# Patient Record
Sex: Male | Born: 1975 | Race: Black or African American | Hispanic: No | Marital: Single | Smoking: Former smoker
Health system: Southern US, Community
[De-identification: ages and names within clinical notes are randomized; demographics above are authoritative.]

## PROBLEM LIST (undated history)

## (undated) DIAGNOSIS — I10 Essential (primary) hypertension: Secondary | ICD-10-CM

## (undated) DIAGNOSIS — R946 Abnormal results of thyroid function studies: Secondary | ICD-10-CM

## (undated) DIAGNOSIS — K859 Acute pancreatitis without necrosis or infection, unspecified: Secondary | ICD-10-CM

## (undated) HISTORY — PX: NO PAST SURGERIES: SHX2092

---

## 2003-08-02 ENCOUNTER — Encounter: Payer: Self-pay | Admitting: Emergency Medicine

## 2003-08-02 ENCOUNTER — Emergency Department (HOSPITAL_COMMUNITY): Admission: EM | Admit: 2003-08-02 | Discharge: 2003-08-02 | Payer: Self-pay | Admitting: Emergency Medicine

## 2007-03-31 ENCOUNTER — Emergency Department (HOSPITAL_COMMUNITY): Admission: EM | Admit: 2007-03-31 | Discharge: 2007-03-31 | Payer: Self-pay | Admitting: Emergency Medicine

## 2010-12-31 ENCOUNTER — Ambulatory Visit: Payer: Self-pay | Admitting: Internal Medicine

## 2010-12-31 DIAGNOSIS — I10 Essential (primary) hypertension: Secondary | ICD-10-CM

## 2011-07-25 DIAGNOSIS — K859 Acute pancreatitis without necrosis or infection, unspecified: Secondary | ICD-10-CM

## 2011-07-25 HISTORY — DX: Acute pancreatitis without necrosis or infection, unspecified: K85.90

## 2011-08-02 ENCOUNTER — Encounter: Payer: Self-pay | Admitting: Emergency Medicine

## 2011-08-02 ENCOUNTER — Other Ambulatory Visit: Payer: Self-pay

## 2011-08-02 ENCOUNTER — Ambulatory Visit: Payer: Self-pay | Admitting: Internal Medicine

## 2011-08-02 ENCOUNTER — Emergency Department (HOSPITAL_COMMUNITY): Payer: 59

## 2011-08-02 ENCOUNTER — Inpatient Hospital Stay (HOSPITAL_COMMUNITY)
Admission: EM | Admit: 2011-08-02 | Discharge: 2011-08-11 | DRG: 439 | Disposition: A | Discharge status: Home / Self Care | Payer: 59 | Attending: Internal Medicine | Admitting: Internal Medicine

## 2011-08-02 DIAGNOSIS — E876 Hypokalemia: Secondary | ICD-10-CM | POA: Diagnosis present

## 2011-08-02 DIAGNOSIS — K859 Acute pancreatitis without necrosis or infection, unspecified: Principal | ICD-10-CM

## 2011-08-02 DIAGNOSIS — M109 Gout, unspecified: Secondary | ICD-10-CM | POA: Diagnosis not present

## 2011-08-02 DIAGNOSIS — R7309 Other abnormal glucose: Secondary | ICD-10-CM | POA: Diagnosis present

## 2011-08-02 DIAGNOSIS — K852 Alcohol induced acute pancreatitis without necrosis or infection: Secondary | ICD-10-CM | POA: Diagnosis present

## 2011-08-02 DIAGNOSIS — I10 Essential (primary) hypertension: Secondary | ICD-10-CM

## 2011-08-02 DIAGNOSIS — E872 Acidosis, unspecified: Secondary | ICD-10-CM | POA: Diagnosis not present

## 2011-08-02 DIAGNOSIS — R739 Hyperglycemia, unspecified: Secondary | ICD-10-CM | POA: Diagnosis present

## 2011-08-02 DIAGNOSIS — F1010 Alcohol abuse, uncomplicated: Secondary | ICD-10-CM | POA: Diagnosis present

## 2011-08-02 HISTORY — DX: Essential (primary) hypertension: I10

## 2011-08-02 LAB — COMPREHENSIVE METABOLIC PANEL
ALT: 25 U/L (ref 0–53)
AST: 25 U/L (ref 0–37)
Alkaline Phosphatase: 86 U/L (ref 39–117)
CO2: 23 mEq/L (ref 19–32)
Calcium: 9.9 mg/dL (ref 8.4–10.5)
Chloride: 94 mEq/L — ABNORMAL LOW (ref 96–112)
GFR calc Af Amer: >90 mL/min (ref >90)
Total Protein: 8.2 g/dL (ref 6.0–8.3)

## 2011-08-02 LAB — CBC
HCT: 40.6 % (ref 39.0–52.0)
MCH: 32 pg (ref 26.0–34.0)
MCV: 94.2 fL (ref 78.0–100.0)
RDW: 13.3 % (ref 11.5–15.5)
WBC: 17.5 10*3/uL — ABNORMAL HIGH (ref 4.0–10.5)

## 2011-08-02 LAB — LIPID PANEL
Cholesterol: 173 mg/dL (ref 0–200)
HDL: 93 mg/dL (ref >39)
LDL Cholesterol: 70 mg/dL (ref 0–99)
Total CHOL/HDL Ratio: 1.9 RATIO
Triglycerides: 51 mg/dL (ref <150)
VLDL: 10 mg/dL (ref 0–40)

## 2011-08-02 LAB — POCT I-STAT, CHEM 8
BUN: 12 mg/dL (ref 6–23)
Calcium, Ion: 1.17 mmol/L (ref 1.12–1.32)
Chloride: 99 mEq/L (ref 96–112)
Creatinine, Ser: 1.2 mg/dL (ref 0.50–1.35)
Glucose, Bld: 177 mg/dL — ABNORMAL HIGH (ref 70–99)
HCT: 45 % (ref 39.0–52.0)
Potassium: 3.2 mEq/L — ABNORMAL LOW (ref 3.5–5.1)

## 2011-08-02 MED ORDER — THIAMINE HCL 100 MG/ML IJ SOLN: Freq: Once | INTRAVENOUS | Status: DC

## 2011-08-02 MED ORDER — POTASSIUM CHLORIDE 10 MEQ/100ML IV SOLN
10.0000 meq | INTRAVENOUS | Status: AC
  Administered 2011-08-02 – 2011-08-03 (×4): 10 meq via INTRAVENOUS
  Filled 2011-08-02 (×4): qty 100

## 2011-08-02 MED ORDER — ONDANSETRON HCL 4 MG/2ML IJ SOLN
4.0000 mg | Freq: Once | INTRAMUSCULAR | Status: AC
  Administered 2011-08-02: 4 mg via INTRAVENOUS
  Filled 2011-08-02: qty 2

## 2011-08-02 MED ORDER — FAMOTIDINE IN NACL 20-0.9 MG/50ML-% IV SOLN
20.0000 mg | Freq: Once | INTRAVENOUS | Status: AC
  Administered 2011-08-02: 20 mg via INTRAVENOUS
  Filled 2011-08-02: qty 50

## 2011-08-02 MED ORDER — SODIUM CHLORIDE 0.9 % IV SOLN
INTRAVENOUS | Status: AC
  Administered 2011-08-02: 23:00:00 via INTRAVENOUS

## 2011-08-02 MED ORDER — HYDROMORPHONE HCL 1 MG/ML IJ SOLN
1.0000 mg | Freq: Once | INTRAMUSCULAR | Status: AC
  Administered 2011-08-02: 1 mg via INTRAVENOUS
  Filled 2011-08-02: qty 1

## 2011-08-02 MED ORDER — THIAMINE HCL 100 MG/ML IJ SOLN
Freq: Once | INTRAVENOUS | Status: AC
  Administered 2011-08-03: 01:00:00 via INTRAVENOUS
  Filled 2011-08-02: qty 1000

## 2011-08-02 MED ORDER — GI COCKTAIL ~~LOC~~
30.0000 mL | Freq: Once | ORAL | Status: AC
  Administered 2011-08-02: 30 mL via ORAL
  Filled 2011-08-02: qty 30

## 2011-08-02 MED ORDER — LABETALOL HCL 5 MG/ML IV SOLN
10.0000 mg | Freq: Once | INTRAVENOUS | Status: AC
Start: 2011-08-02 — End: 2011-08-02
  Administered 2011-08-02: 10 mg via INTRAVENOUS
  Filled 2011-08-02: qty 4

## 2011-08-02 MED ORDER — HYDROMORPHONE HCL 1 MG/ML IJ SOLN: 1.0000 mg | INTRAMUSCULAR | Status: DC | PRN

## 2011-08-02 MED ORDER — ONDANSETRON HCL 4 MG/2ML IJ SOLN: 4.0000 mg | Freq: Three times a day (TID) | INTRAMUSCULAR | Status: DC | PRN

## 2011-08-02 MED ORDER — SODIUM CHLORIDE 0.9 % IV SOLN
999.0000 mL | Freq: Once | INTRAVENOUS | Status: AC
  Administered 2011-08-02: 999 mL via INTRAVENOUS

## 2011-08-02 NOTE — ED Notes (Signed)
Patient states he is starting to hurt bad again. Pain 8\10. Would like something for pain. Denies nausea. Denies needs. Call bell and visitor at bedside. md aware.

## 2011-08-02 NOTE — H&P (Signed)
PCP:   No primary provider on file.   Chief Complaint:  Epigastric pain x 2 days  HPI: Geoffrey Conner is an 35 y.o. male, African American with a history of hypertension and a strong history of alcohol abuse, otherwise in good health until Sunday he developed severe epigastric pain, no radiation. No nausea or vomiting or diarrhea. No fever.  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 He is tried no home remedies for the pain, was unable to go to work today.  Has no past history of abdominal  pain or liver disease.  The emergency room patient's lipase is markedly elevated and the hospitalist service was called to assist with management.  Past Medical History  Diagnosis Date  . Hypertension     History reviewed. No pertinent past surgical history.  Medications:  HOME MEDS: Prior to Admission medications   Medication Sig Start Date End Date Taking? Authorizing Provider  amLODipine (NORVASC) 10 MG tablet Take 10 mg by mouth daily.     Yes Historical Provider, MD  atenolol (TENORMIN) 50 MG tablet Take 50 mg by mouth every morning.     Yes Historical Provider, MD    PRIOR TO [facility] MED[facility] Medications Prior to Admission  Medication Dose Route Frequency Provider Last Rate Last Dose  . 0.9 %  sodium chloride infusion  999 mL Intravenous Once Gerhard Munch, MD   999 mL at 08/02/11 2103  . 0.9 %  sodium chloride infusion   Intravenous STAT Gerhard Munch, MD 150 mL/hr at 08/02/11 2245    . famotidine (PEPCID) IVPB 20 mg  20 mg Intravenous Once Gerhard Munch, MD   20 mg at 08/02/11 2104  . gi cocktail  30 mL Oral Once Gerhard Munch, MD   30 mL at 08/02/11 2105  . HYDROmorphone (DILAUDID) injection 1 mg  1 mg Intravenous Once Gerhard Munch, MD   1 mg at 08/02/11 2246  . HYDROmorphone (DILAUDID) injection 1 mg  1 mg Intravenous Q4H PRN Gerhard Munch, MD      . labetalol (NORMODYNE,TRANDATE) 5 MG/ML injection 10 mg   10 mg Intravenous Once Gerhard Munch, MD   10 mg at 08/02/11 2150  . ondansetron (ZOFRAN) injection 4 mg  4 mg Intravenous Once Gerhard Munch, MD   4 mg at 08/02/11 2242  . ondansetron (ZOFRAN) injection 4 mg  4 mg Intravenous Q8H PRN Gerhard Munch, MD      . potassium chloride 10 mEq in 100 mL IVPB  10 mEq Intravenous Q1 Hr x 4 Vania Rea      . sodium chloride 0.9 % 1,000 mL with thiamine 100 mg, folic acid 1 mg, multivitamins adult 10 mL infusion   Intravenous Once Vania Rea      . DISCONTD: dextrose 5 % and 0.9% NaCl 1,000 mL with potassium chloride 40 mEq, thiamine (B-1) 100 mg, folic acid 1 mg, multivitamins adult (MVI -12) 10 mL infusion   Intravenous Once Vania Rea       No current outpatient prescriptions on file as of 08/02/2011.    Allergies:  Allergies  Allergen Reactions  . Peanut-Containing Drug Products Shortness Of Breath and Swelling    Social History:   reports that he has quit smoking. He does not have any smokeless tobacco history on file. He reports that he drinks alcohol. He reports that he does not use illicit drugs.  Drinks 4-5 fifths of vodka per  week with a friends, drinks 2 beers per day  Family History: Family History  Problem Relation Age of Onset  . Hypertension Mother   . Hypertension Father   . Cancer Mother     breast 9 yrs ago    Rewiew of Systems:  The patient denies anorexia, fever, weight loss,, vision loss, decreased hearing, hoarseness,  syncope, dyspnea on exertion, peripheral edema, balance deficits, hemoptysis, abdominal pain, melena, hematochezia, severe indigestion/heartburn, hematuria, incontinence, genital sores, muscle weakness, suspicious skin lesions, transient blindness, difficulty walking, depression, unusual weight change, abnormal bleeding, enlarged lymph nodes, angioedema, and breast masses.  Physical Exam: Filed Vitals:   08/02/11 2116 08/02/11 2200 08/02/11 2230 08/02/11 2239  BP: 155/107 166/108  159/101 160/99  Pulse: 75 118 120 129  Temp:   99.5 F (37.5 C)   TempSrc: Oral  Oral   Resp: 20   24  Height:      Weight:      SpO2: 95% 100% 100% 100%   Blood pressure 160/99, pulse 129, temperature 99.5 F (37.5 C), temperature source Oral, resp. rate 24, height 5\' 11"  (1.803 m), weight 81.647 kg (180 lb), SpO2 100.00%.  GEN: pleasant apprehensive African American man  lying in the stretcher ; cooperative with exam PSYCH: He is alert and oriented x4;  affect is normal HEENT: Mucous membranes pink and anicteric; PERRLA; EOM intact; no cervical lymphadenopathy nor thyromegaly or carotid bruit; no JVD; bilateral parotid hypertrophy; muscular neck Breasts:: normal  CHEST WALL: No tenderness CHEST: Normal respiration, clear to auscultation bilaterally HEART: Regular rate and  tachycardia no murmurs rubs or gallops BACK: No kyphosis or scoliosis; no CVA tenderness ABDOMEN:  mildly obese , epigastric tenderness ; no masses, no organomegaly,  no pannus; no intertriginous candida. Rectal Exam: Not done EXTREMITIES: No bone or joint deformity; ; no edema; no ulcerations. Genitalia: not examined PULSES: 2+ and symmetric SKIN: Normal hydration no rash or ulceration CNS: Cranial nerves 2-12 grossly intact no focal neurologic deficit   Labs & Imaging Results for orders placed during the hospital encounter of 08/02/11 (from the past 48 hour(s))  POCT I-STAT TROPONIN I     Status: Normal   Collection Time   08/02/11  8:52 PM      Component Value Range Comment   Troponin i, poc 0.00  0.00 - 0.08 (ng/mL)    Comment 3            POCT I-STAT, CHEM 8     Status: Abnormal   Collection Time   08/02/11  8:54 PM      Component Value Range Comment   Sodium 138  135 - 145 (mEq/L)    Potassium 3.2 (*) 3.5 - 5.1 (mEq/L)    Chloride 99  96 - 112 (mEq/L)    BUN 12  6 - 23 (mg/dL)    Creatinine, Ser 9.14  0.50 - 1.35 (mg/dL)    Glucose, Bld 782 (*) 70 - 99 (mg/dL)    Calcium, Ion 9.56  1.12 - 1.32  (mmol/L)    TCO2 21  0 - 100 (mmol/L)    Hemoglobin 15.3  13.0 - 17.0 (g/dL)    HCT 21.3  08.6 - 57.8 (%)   CBC     Status: Abnormal   Collection Time   08/02/11  9:11 PM      Component Value Range Comment   WBC 17.5 (*) 4.0 - 10.5 (K/uL)    RBC 4.31  4.22 - 5.81 (MIL/uL)  Hemoglobin 13.8  13.0 - 17.0 (g/dL)    HCT 16.1  09.6 - 04.5 (%)    MCV 94.2  78.0 - 100.0 (fL)    MCH 32.0  26.0 - 34.0 (pg)    MCHC 34.0  30.0 - 36.0 (g/dL)    RDW 40.9  81.1 - 91.4 (%)    Platelets 232  150 - 400 (K/uL)   COMPREHENSIVE METABOLIC PANEL     Status: Abnormal   Collection Time   08/02/11  9:11 PM      Component Value Range Comment   Sodium 134 (*) 135 - 145 (mEq/L)    Potassium 3.0 (*) 3.5 - 5.1 (mEq/L)    Chloride 94 (*) 96 - 112 (mEq/L)    CO2 23  19 - 32 (mEq/L)    Glucose, Bld 168 (*) 70 - 99 (mg/dL)    BUN 12  6 - 23 (mg/dL)    Creatinine, Ser 7.82  0.50 - 1.35 (mg/dL)    Calcium 9.9  8.4 - 10.5 (mg/dL)    Total Protein 8.2  6.0 - 8.3 (g/dL)    Albumin 4.1  3.5 - 5.2 (g/dL)    AST 25  0 - 37 (U/L)    ALT 25  0 - 53 (U/L)    Alkaline Phosphatase 86  39 - 117 (U/L)    Total Bilirubin 1.0  0.3 - 1.2 (mg/dL)    GFR calc non Af Amer 85 (*) >90 (mL/min)    GFR calc Af Amer >90  >90 (mL/min)   LIPASE, BLOOD     Status: Abnormal   Collection Time   08/02/11  9:11 PM      Component Value Range Comment   Lipase 1768 (*) 11 - 59 (U/L)   LIPID PANEL     Status: Normal   Collection Time   08/02/11 10:35 PM      Component Value Range Comment   Cholesterol 173  0 - 200 (mg/dL)    Triglycerides 51  <956 (mg/dL)    HDL 93  >21 (mg/dL)    Total CHOL/HDL Ratio 1.9      VLDL 10  0 - 40 (mg/dL)    LDL Cholesterol 70  0 - 99 (mg/dL)    Dg Chest 2 View  30/05/6577  *RADIOLOGY REPORT*  Clinical Data: Sternal and epigastric pain.  Fever.  CHEST - 2 VIEW  Comparison: None  Findings: Artifact overlies the chest.  Heart size is normal. Mediastinal shadows are normal.  The lungs are clear.  No effusions.   No bony abnormalities.  IMPRESSION: No active disease  Original Report Authenticated By: Thomasenia Sales, M.D.      Assessment Present on Admission:  .Acute alcoholic pancreatitis .HTN (hypertension) .Hyperglycemia .Hypokalemia with normal acid-base balance  PLAN: This gentleman for vigorous hydration and repletion of potassium; will add dextrose to his IV fluids and B. vitamin supplementation; will keep him nil by mouth until we feel his pancreatitis is sufficiently recovered;  Indicated in order relation ship between alcohol abuse and pancreatitis; and off for outpatient assistance with alcohol cessation;   Check hemoglobin A1c and monitor blood sugar while in hospital;  Monitor blood pressure one in hospital and use parenteral antihypertensives as necessary.  Other plans as per orders.    Ranae Casebier 08/02/2011, 11:44 PM

## 2011-08-02 NOTE — ED Notes (Signed)
Medicated per md order. Tolerated well. Pain 10/10. Given warm blanket per request. Denies any other needs. Family with patient. Call bell at bedside. Will continue to monitor.

## 2011-08-02 NOTE — ED Notes (Signed)
Patient c/o epigastric and central chest pain that started on Sunday night.  Denies any shortness of breath, nausea or vomiting.

## 2011-08-02 NOTE — ED Notes (Signed)
Patient states he started having pain in epigastric region on Sunday. Got worse this morning around 0600 so he decided to come in and get checked out. Pain characterized as tightness. Nothing makes pain better. Deep breathing makes it worse. Denies any nausea, vomiting, diarrhea. Sinus tach on monitor at 120. Strong, regular heart sounds. Lung sounds equal and clear in all fields. Denies any shortness of breath. Placed on O2 2L [place]. Awaiting MD eval.

## 2011-08-02 NOTE — ED Notes (Signed)
Spoke with MD Orvan Falconer. Does not want to medicate for fever at this time.

## 2011-08-02 NOTE — ED Notes (Signed)
Attempting second iv placement per hospitalist order. Pain 5\10 at this time. Denies nausea. Patient in no distress. Equal chest rise and fall. A&O x 4. Call bell and visitor at bedside. Awaiting admission to hospital.

## 2011-08-02 NOTE — ED Provider Notes (Signed)
Scribed for Gerhard Munch, MD, the patient was seen in room APA18/APA18 . This chart was scribed by Ellie Lunch. This patient's care was started at 8:39 PM.   CSN: [id number] Arrival date & time: 08/02/2011  8:33 PM  Chief Complaint  Patient presents with  . Chest Pain    (Consider location/radiation/quality/duration/timing/severity/associated sxs/prior treatment) HPI Geoffrey Conner is a 35 y.o. male who presents to the Emergency Department complaining of chest pain. Patient c/o epigastric and central chest pain that started on Sunday night 2 days ago. Pain started after he finished eating dinner (which included a couple beers). Pain has been constant. Is located in central chest. Does not radiate. No modifying factors such as deep breathing or exertion.  Denies any shortness of breath, nausea or vomiting. No history of similar Sx. Denies pedal edema, ST, visual problems, HA, confusion, diaphoresis.   Past Medical History  Diagnosis Date  . Hypertension     History reviewed. No pertinent past surgical history.  No family history on file.  History  Substance Use Topics  . Smoking status: Former Games developer  . Smokeless tobacco: Not on file  . Alcohol Use: Yes     occ    Review of Systems 10 Systems reviewed and are negative for acute change except as noted in the HPI.  Allergies  Review of patient's allergies indicates no known allergies.  Home Medications  No current outpatient prescriptions on file.  BP 174/101  Pulse 120  Resp 20  Ht 5\' 11"  (1.803 m)  Wt 180 lb (81.647 kg)  BMI 25.10 kg/m2  SpO2 100%  Physical Exam  Nursing note and vitals reviewed. Constitutional: He is oriented to person, place, and time. He appears well-developed and well-nourished.  HENT:  Head: Normocephalic and atraumatic.  Eyes: Conjunctivae are normal. Pupils are equal, round, and reactive to light.  Neck: Neck supple.  Cardiovascular:  No murmur heard.      tachycardic    Pulmonary/Chest: No respiratory distress.  Abdominal: Soft. There is no tenderness (epigastric tenderness).  Musculoskeletal: He exhibits no edema.  Neurological: He is alert and oriented to person, place, and time.  Skin: Skin is warm and dry.  Psychiatric: He has a normal mood and affect.   Procedures (including critical care time)  OTHER DATA REVIEWED: Nursing notes, vital signs, and past medical records reviewed.   DIAGNOSTIC STUDIES: Oxygen Saturation is 100% on room air, normal by my interpretation.    LABS / RADIOLOGY:  Labs Reviewed  POCT I-STAT, CHEM 8 - Abnormal; Notable for the following:    Potassium 3.2 (*)    Glucose, Bld 177 (*)    All other components within normal limits  POCT I-STAT TROPONIN I  I-STAT TROPONIN I  I-STAT, CHEM 8  CBC  COMPREHENSIVE METABOLIC PANEL  LIPASE, BLOOD   Dg Chest 2 View  08/02/2011  *RADIOLOGY REPORT*  Clinical Data: Sternal and epigastric pain.  Fever.  CHEST - 2 VIEW  Comparison: None  Findings: Artifact overlies the chest.  Heart size is normal. Mediastinal shadows are normal.  The lungs are clear.  No effusions.  No bony abnormalities.  IMPRESSION: No active disease  Original Report Authenticated By: Thomasenia Sales, M.D.    Date: 08/02/2011  Rate: 124  Rhythm: sinus tachycardia  QRS Axis: normal  Intervals: normal  ST/T Wave abnormalities: nonspecific T wave changes  Conduction Disutrbances:LVH w repol  Narrative Interpretation:   Old EKG Reviewed: none available    ED COURSE /  COORDINATION OF CARE:  MDM: 35yo M p/w CP for several days and on initial exam is not in distress, but continues to have abd pain.  ECG and neg trop's are reassuring.  Labs notable for pancreatitis.  Patient will be admitted for further e/m  MEDICATIONS GIVEN IN THE E.D.  Medications  0.9 %  sodium chloride infusion (not administered)  famotidine (PEPCID) IVPB 20 mg (not administered)  gi cocktail (not administered)   SCRIBE  ATTESTATION:I personally performed the services described in this documentation, which was scribed in my presence. The recorded information has been reviewed and considered.          Gerhard Munch, MD 08/02/11 2208

## 2011-08-02 NOTE — ED Notes (Signed)
hopspitalist remains at bedside with patient.

## 2011-08-02 NOTE — ED Notes (Signed)
Into room to see patient. Resting sitting up in bed. Pain 5\10. Denies nausea. Denies any needs. Lights turned off for patient comfort. Family remains with patient. Notified awaiting bed assignment for admission. Verbalized understanding. Dr Orvan Falconer, hospitalist, at bedside to evaluate.

## 2011-08-02 NOTE — ED Notes (Signed)
Patient to radiology via stretcher

## 2011-08-02 NOTE — ED Notes (Signed)
MD at bedside to evaluate.

## 2011-08-02 NOTE — ED Notes (Signed)
Pain reassessed. Pian 6\10 at this time. Normal saline continues to infuse well with no signs of infiltration BP reassessed. 177/106. No acute distress. Call bell and visitor at bedside. Notified awaiting test results and radiology results. Verbalized understanding. MD aware of BP.

## 2011-08-02 NOTE — ED Notes (Signed)
MD at bedside with patient to discuss plan of care. 

## 2011-08-02 NOTE — ED Notes (Signed)
Warm blanket removed from patient due to low grade temp. Patient in no distress. Equal chest rise and fall. Medicated as ordered. Call bell at bedside. Bed in low position and locked with side rails up. A&o x 4.

## 2011-08-02 NOTE — ED Notes (Signed)
Patient back to room from radiology. Pain 7\10 at this time. In no distress. Denies any needs at this time. Family with patient. Call bell at bedside. Bed in low position and locked with side rails up.

## 2011-08-03 ENCOUNTER — Inpatient Hospital Stay (HOSPITAL_COMMUNITY): Payer: 59

## 2011-08-03 ENCOUNTER — Encounter (HOSPITAL_COMMUNITY): Payer: Self-pay | Admitting: *Deleted

## 2011-08-03 LAB — BASIC METABOLIC PANEL
CO2: 24 mEq/L (ref 19–32)
Calcium: 8.6 mg/dL (ref 8.4–10.5)
Creatinine, Ser: 1.04 mg/dL (ref 0.50–1.35)
GFR calc Af Amer: >90 mL/min (ref >90)
Glucose, Bld: 120 mg/dL — ABNORMAL HIGH (ref 70–99)

## 2011-08-03 LAB — CBC
MCH: 32.3 pg (ref 26.0–34.0)
MCH: 31.5 pg (ref 26.0–34.0)
MCHC: 34 g/dL (ref 30.0–36.0)
MCV: 95 fL (ref 78.0–100.0)
MCV: 95.9 fL (ref 78.0–100.0)
Platelets: 153 10*3/uL (ref 150–400)
Platelets: 192 10*3/uL (ref 150–400)
RDW: 13.5 % (ref 11.5–15.5)
RDW: 13.8 % (ref 11.5–15.5)
WBC: 17.2 10*3/uL — ABNORMAL HIGH (ref 4.0–10.5)

## 2011-08-03 LAB — LIPASE, BLOOD: Lipase: 772 U/L — ABNORMAL HIGH (ref 11–59)

## 2011-08-03 LAB — PROTIME-INR
INR: 1.02 (ref 0.00–1.49)
Prothrombin Time: 13.6 seconds (ref 11.6–15.2)

## 2011-08-03 LAB — GLUCOSE, CAPILLARY
Glucose-Capillary: 112 mg/dL — ABNORMAL HIGH (ref 70–99)
Glucose-Capillary: 116 mg/dL — ABNORMAL HIGH (ref 70–99)
Glucose-Capillary: 131 mg/dL — ABNORMAL HIGH (ref 70–99)

## 2011-08-03 LAB — HEMOGLOBIN A1C
Hgb A1c MFr Bld: 5.7 % — ABNORMAL HIGH (ref <5.7)
Mean Plasma Glucose: 117 mg/dL — ABNORMAL HIGH (ref <117)

## 2011-08-03 LAB — URINALYSIS, ROUTINE W REFLEX MICROSCOPIC
Glucose, UA: NEGATIVE mg/dL
Ketones, ur: NEGATIVE mg/dL
Leukocytes, UA: NEGATIVE
Nitrite: NEGATIVE
Protein, ur: 100 mg/dL — AB

## 2011-08-03 LAB — MAGNESIUM: Magnesium: 2.1 mg/dL (ref 1.5–2.5)

## 2011-08-03 MED ORDER — BIOTENE DRY MOUTH MT LIQD
Freq: Two times a day (BID) | OROMUCOSAL | Status: DC
  Administered 2011-08-03: 08:00:00 via OROMUCOSAL
  Administered 2011-08-03: 1 via OROMUCOSAL
  Administered 2011-08-04 (×2): via OROMUCOSAL
  Administered 2011-08-05: 1 via OROMUCOSAL
  Administered 2011-08-05: 20:00:00 via OROMUCOSAL
  Administered 2011-08-06: 1 via OROMUCOSAL
  Administered 2011-08-06 – 2011-08-11 (×8): via OROMUCOSAL

## 2011-08-03 MED ORDER — SODIUM CHLORIDE 0.45 % IV SOLN
INTRAVENOUS | Status: DC
  Administered 2011-08-03 – 2011-08-07 (×10): via INTRAVENOUS
  Filled 2011-08-03 (×16): qty 1000

## 2011-08-03 MED ORDER — VITAMIN B-1 100 MG PO TABS
100.0000 mg | ORAL_TABLET | Freq: Every day | ORAL | Status: DC
  Administered 2011-08-03 – 2011-08-11 (×8): 100 mg via ORAL
  Filled 2011-08-03 (×8): qty 1

## 2011-08-03 MED ORDER — ATENOLOL 25 MG PO TABS
50.0000 mg | ORAL_TABLET | ORAL | Status: DC
  Administered 2011-08-03 – 2011-08-11 (×9): 50 mg via ORAL
  Filled 2011-08-03 (×10): qty 2

## 2011-08-03 MED ORDER — LORAZEPAM 2 MG/ML IJ SOLN: 0.0000 mg | Freq: Four times a day (QID) | INTRAMUSCULAR | Status: DC

## 2011-08-03 MED ORDER — FAMOTIDINE IN NACL 20-0.9 MG/50ML-% IV SOLN
20.0000 mg | Freq: Two times a day (BID) | INTRAVENOUS | Status: DC
  Administered 2011-08-03 (×2): 20 mg via INTRAVENOUS
  Filled 2011-08-03 (×4): qty 50

## 2011-08-03 MED ORDER — ONDANSETRON HCL 4 MG/2ML IJ SOLN: 4.0000 mg | Freq: Four times a day (QID) | INTRAMUSCULAR | Status: DC | PRN

## 2011-08-03 MED ORDER — ONDANSETRON HCL 4 MG PO TABS: 4.0000 mg | ORAL_TABLET | Freq: Four times a day (QID) | ORAL | Status: DC | PRN

## 2011-08-03 MED ORDER — HYDROMORPHONE HCL 1 MG/ML IJ SOLN
1.0000 mg | INTRAMUSCULAR | Status: DC | PRN
  Administered 2011-08-03 (×5): 2 mg via INTRAVENOUS
  Administered 2011-08-03 – 2011-08-04 (×3): 1 mg via INTRAVENOUS
  Administered 2011-08-04 – 2011-08-07 (×11): 2 mg via INTRAVENOUS
  Administered 2011-08-08 – 2011-08-09 (×6): 1 mg via INTRAVENOUS
  Administered 2011-08-09 (×2): 2 mg via INTRAVENOUS
  Administered 2011-08-09: 1 mg via INTRAVENOUS
  Administered 2011-08-09 (×4): 2 mg via INTRAVENOUS
  Filled 2011-08-03 (×2): qty 2
  Filled 2011-08-03: qty 1
  Filled 2011-08-03 (×3): qty 2
  Filled 2011-08-03: qty 1
  Filled 2011-08-03 (×2): qty 2
  Filled 2011-08-03: qty 1
  Filled 2011-08-03: qty 2
  Filled 2011-08-03 (×2): qty 1
  Filled 2011-08-03 (×5): qty 2
  Filled 2011-08-03 (×3): qty 1
  Filled 2011-08-03: qty 2
  Filled 2011-08-03: qty 1
  Filled 2011-08-03 (×4): qty 2
  Filled 2011-08-03: qty 1
  Filled 2011-08-03 (×4): qty 2

## 2011-08-03 MED ORDER — SODIUM CHLORIDE 0.9 % IJ SOLN
INTRAMUSCULAR | Status: AC
  Administered 2011-08-03: 10 mL
  Filled 2011-08-03: qty 10

## 2011-08-03 MED ORDER — M.V.I. ADULT IV INJ
INJECTION | INTRAVENOUS | Status: AC
  Filled 2011-08-03: qty 10

## 2011-08-03 MED ORDER — ENOXAPARIN SODIUM 40 MG/0.4ML ~~LOC~~ SOLN
40.0000 mg | SUBCUTANEOUS | Status: DC
  Administered 2011-08-03 – 2011-08-08 (×6): 40 mg via SUBCUTANEOUS
  Filled 2011-08-03 (×6): qty 0.4

## 2011-08-03 MED ORDER — ACETAMINOPHEN 10 MG/ML IV SOLN
650.0000 mg | INTRAVENOUS | Status: DC | PRN
  Administered 2011-08-03: 650 mg via INTRAVENOUS
  Filled 2011-08-03: qty 65

## 2011-08-03 MED ORDER — ENALAPRILAT 1.25 MG/ML IV SOLN
0.6250 mg | Freq: Four times a day (QID) | INTRAVENOUS | Status: DC
  Administered 2011-08-03 (×2): 0.625 mg via INTRAVENOUS
  Filled 2011-08-03 (×2): qty 2

## 2011-08-03 MED ORDER — SODIUM CHLORIDE 0.9 % IV BOLUS (SEPSIS)
1000.0000 mL | Freq: Once | INTRAVENOUS | Status: AC
  Administered 2011-08-03: 1000 mL via INTRAVENOUS

## 2011-08-03 MED ORDER — THIAMINE HCL 100 MG/ML IJ SOLN
INTRAMUSCULAR | Status: AC
  Filled 2011-08-03: qty 2

## 2011-08-03 MED ORDER — FLEET ENEMA 7-19 GM/118ML RE ENEM: 1.0000 | ENEMA | RECTAL | Status: DC | PRN

## 2011-08-03 MED ORDER — LORAZEPAM 2 MG/ML IJ SOLN: 0.0000 mg | Freq: Two times a day (BID) | INTRAMUSCULAR | Status: DC

## 2011-08-03 MED ORDER — THERA M PLUS PO TABS
1.0000 | ORAL_TABLET | Freq: Every day | ORAL | Status: DC
  Administered 2011-08-03 – 2011-08-11 (×9): 1 via ORAL
  Filled 2011-08-03 (×9): qty 1

## 2011-08-03 MED ORDER — THIAMINE HCL 100 MG/ML IJ SOLN
100.0000 mg | Freq: Every day | INTRAMUSCULAR | Status: DC
  Administered 2011-08-04: 100 mg via INTRAVENOUS
  Filled 2011-08-03: qty 2

## 2011-08-03 MED ORDER — POTASSIUM CHLORIDE 2 MEQ/ML IV SOLN
INTRAVENOUS | Status: DC
  Administered 2011-08-03: 01:00:00 via INTRAVENOUS
  Filled 2011-08-03 (×3): qty 1000

## 2011-08-03 MED ORDER — LORAZEPAM 1 MG PO TABS: 1.0000 mg | ORAL_TABLET | Freq: Four times a day (QID) | ORAL | Status: AC | PRN

## 2011-08-03 MED ORDER — ACETAMINOPHEN 10 MG/ML IV SOLN
1000.0000 mg | Freq: Four times a day (QID) | INTRAVENOUS | Status: AC | PRN
  Administered 2011-08-03 – 2011-08-04 (×2): 1000 mg via INTRAVENOUS
  Filled 2011-08-03: qty 100

## 2011-08-03 MED ORDER — BISACODYL 10 MG RE SUPP: 10.0000 mg | RECTAL | Status: DC | PRN

## 2011-08-03 MED ORDER — METOPROLOL TARTRATE 1 MG/ML IV SOLN
5.0000 mg | Freq: Four times a day (QID) | INTRAVENOUS | Status: DC
  Administered 2011-08-03 (×2): 5 mg via INTRAVENOUS
  Filled 2011-08-03 (×2): qty 5

## 2011-08-03 MED ORDER — FOLIC ACID 5 MG/ML IJ SOLN
INTRAMUSCULAR | Status: AC
  Filled 2011-08-03: qty 0.2

## 2011-08-03 MED ORDER — LORAZEPAM 2 MG/ML IJ SOLN: 1.0000 mg | Freq: Four times a day (QID) | INTRAMUSCULAR | Status: AC | PRN

## 2011-08-03 MED ORDER — INSULIN ASPART 100 UNIT/ML ~~LOC~~ SOLN
0.0000 [IU] | SUBCUTANEOUS | Status: DC
  Administered 2011-08-03 – 2011-08-07 (×6): 1 [IU] via SUBCUTANEOUS
  Filled 2011-08-03: qty 3

## 2011-08-03 MED ORDER — ACETAMINOPHEN 10 MG/ML IV SOLN
INTRAVENOUS | Status: AC
  Filled 2011-08-03: qty 100

## 2011-08-03 MED ORDER — AMLODIPINE BESYLATE 5 MG PO TABS
10.0000 mg | ORAL_TABLET | Freq: Every day | ORAL | Status: DC
  Administered 2011-08-03 – 2011-08-04 (×2): 10 mg via ORAL
  Filled 2011-08-03 (×2): qty 2

## 2011-08-03 NOTE — Progress Notes (Signed)
Subjective: Pain is better, fever this am Objective: Vital signs in last 24 hours: Temp:  [99.5 F (37.5 C)-102.9 F (39.4 C)] 102.9 F (39.4 C) (10/10 0511) Pulse Rate:  [75-129] 123  (10/10 0511) Resp:  [16-29] 16  (10/10 0511) BP: (140-174)/(94-111) 159/103 mmHg (10/10 0511) SpO2:  [93 %-100 %] 98 % (10/10 0511) Weight:  [79.7 kg (175 lb 11.3 oz)-81.647 kg (180 lb)] 175 lb 11.3 oz (79.7 kg) (10/10 0054) Weight change:     Intake/Output from previous day: 10/09 0701 - 10/10 0700 In: 1815 [I.V.:750; IV Piggyback:1065] Out: 600 [Urine:600]     Physical Exam: General: Alert, awake, oriented x3, in no acute distress. HEENT: No bruits, no goiter. Heart: Regular rate and rhythm, without murmurs, rubs, gallops, mildly tachycardic Lungs: Clear to auscultation bilaterally. Abdomen: Soft, epigastric tenderness, nondistended, positive bowel sounds. Extremities: No clubbing cyanosis or edema with positive pedal pulses. Neuro: Grossly intact, nonfocal.    Lab Results: Basic Metabolic Panel:  Basename 08/03/11 0457 08/03/11 0147 08/02/11 2111  NA 138 -- 134*  K 4.0 -- 3.0*  CL 102 -- 94*  CO2 24 -- 23  GLUCOSE 120* -- 168*  BUN 9 -- 12  CREATININE 1.04 0.99 --  CALCIUM 8.6 -- 9.9  MG 2.1 -- --  PHOS 2.9 -- --   Liver Function Tests:  [facility] 08/02/11 2111  AST 25  ALT 25  ALKPHOS 86  BILITOT 1.0  PROT 8.2  ALBUMIN 4.1    Basename 08/03/11 0457 08/02/11 2111  LIPASE 772* 1768*  AMYLASE -- --   No results found for this basename: AMMONIA:2 in the last 72 hours CBC:  Basename 08/03/11 0457 08/03/11 0147  WBC 17.2* 15.3*  NEUTROABS -- --  HGB 13.0 12.9*  HCT 39.6 37.9*  MCV 95.9 95.0  PLT 192 153   Cardiac Enzymes: No results found for this basename: CKTOTAL:3,CKMB:3,CKMBINDEX:3,TROPONINI:3 in the last 72 hours BNP: No results found for this basename: POCBNP:3 in the last 72 hours D-Dimer: No results found for this basename: DDIMER:2 in the last 72  hours CBG:  Basename 08/03/11 0726 08/03/11 0452 08/03/11 0113  GLUCAP 110* 116* 112*   Hemoglobin A1C: No results found for this basename: HGBA1C in the last 72 hours Fasting Lipid Panel:  Basename 08/02/11 2235  CHOL 173  HDL 93  LDLCALC 70  TRIG 51  CHOLHDL 1.9  LDLDIRECT --   Thyroid Function Tests: No results found for this basename: TSH,T4TOTAL,FREET4,T3FREE,THYROIDAB in the last 72 hours Anemia Panel: No results found for this basename: VITAMINB12,FOLATE,FERRITIN,TIBC,IRON,RETICCTPCT in the last 72 hours Urine Drug Screen:  Alcohol Level: No results found for this basename: ETH:2 in the last 72 hours Urinalysis:  Misc. Labs:  No results found for this or any previous visit (from the past 240 hour(s)).  Studies/Results: Dg Chest 2 View  08/02/2011  *RADIOLOGY REPORT*  Clinical Data: Sternal and epigastric pain.  Fever.  CHEST - 2 VIEW  Comparison: None  Findings: Artifact overlies the chest.  Heart size is normal. Mediastinal shadows are normal.  The lungs are clear.  No effusions.  No bony abnormalities.  IMPRESSION: No active disease  Original Report Authenticated By: Thomasenia Sales, M.D.    Medications: Scheduled Meds:   . sodium chloride  999 mL Intravenous Once  . sodium chloride   Intravenous STAT  . antiseptic oral rinse   Mouth Rinse BID  . enalaprilat  0.625 mg Intravenous Q6H  . enoxaparin  40 mg Subcutaneous Q24H  .  famotidine  20 mg Intravenous Once  . famotidine (PEPCID) IV  20 mg Intravenous Q12H  . gi cocktail  30 mL Oral Once  .  HYDROmorphone (DILAUDID) injection  1 mg Intravenous Once  . insulin aspart  0-9 Units Subcutaneous Q4H  . labetalol  10 mg Intravenous Once  . LORazepam  0-4 mg Intravenous Q6H   Followed by  . LORazepam  0-4 mg Intravenous Q12H  . metoprolol  5 mg Intravenous Q6H  . multivitamins ther. w/minerals  1 tablet Oral Daily  . ondansetron (ZOFRAN) IV  4 mg Intravenous Once  . potassium chloride  10 mEq Intravenous Q1  Hr x 4  . banana bag IV fluid 1000 mL   Intravenous Once  . sodium chloride  1,000 mL Intravenous Once  . vitamin B-1  100 mg Oral Daily   Or  . thiamine  100 mg Intravenous Daily  . DISCONTD: dextrose 5 % and 0.9% NaCl 1,000 mL with potassium chloride 40 mEq, thiamine (B-1) 100 mg, folic acid 1 mg, multivitamins adult (MVI -12) 10 mL infusion   Intravenous Once   Continuous Infusions:   . dextrose 5 % and 0.9% NaCl 1,000 mL with potassium chloride 20 mEq infusion 150 mL/hr at 08/03/11 0100   PRN Meds:.acetaminophen, bisacodyl, HYDROmorphone, LORazepam, LORazepam, ondansetron (ZOFRAN) IV, ondansetron, sodium phosphate, DISCONTD: acetaminophen, DISCONTD:  HYDROmorphone (DILAUDID) injection, DISCONTD: ondansetron (ZOFRAN) IV  Assessment/Plan:  Principal Problem:  *Acute alcoholic pancreatitis: continue IVF change to half NS, start clear liquids, get abd ultrasound Active Problems:  HTN (hypertension): restart atenolol, amlodipine  Hyperglycemia: change fluids, check HBaic     LOS: 1 day   Geoffrey Conner 08/03/2011, 10:37 AM

## 2011-08-03 NOTE — ED Notes (Signed)
Attempted to call report. Nurse will call back for report

## 2011-08-03 NOTE — ED Notes (Signed)
Assigned room 320. Patient resting in bed with eyes closed. No facial grimaces. No distress. Family with patient. Potassium and ns continues to infuse well. No signs of infiltration.

## 2011-08-03 NOTE — ED Notes (Signed)
Report given to [facility], rn on AP 300. Ready for patient transfer. Normal saline and potassium infusions continue to infuse with no signs of infiltration en route to floor.

## 2011-08-04 ENCOUNTER — Inpatient Hospital Stay (HOSPITAL_COMMUNITY): Payer: 59

## 2011-08-04 DIAGNOSIS — F1010 Alcohol abuse, uncomplicated: Secondary | ICD-10-CM | POA: Diagnosis present

## 2011-08-04 LAB — BASIC METABOLIC PANEL
BUN: 11 mg/dL (ref 6–23)
CO2: 22 mEq/L (ref 19–32)
Calcium: 7.9 mg/dL — ABNORMAL LOW (ref 8.4–10.5)
GFR calc non Af Amer: 70 mL/min — ABNORMAL LOW (ref >90)
Glucose, Bld: 107 mg/dL — ABNORMAL HIGH (ref 70–99)

## 2011-08-04 LAB — GLUCOSE, CAPILLARY
Glucose-Capillary: 105 mg/dL — ABNORMAL HIGH (ref 70–99)
Glucose-Capillary: 110 mg/dL — ABNORMAL HIGH (ref 70–99)
Glucose-Capillary: 130 mg/dL — ABNORMAL HIGH (ref 70–99)
Glucose-Capillary: 131 mg/dL — ABNORMAL HIGH (ref 70–99)

## 2011-08-04 LAB — CBC
HCT: 36.6 % — ABNORMAL LOW (ref 39.0–52.0)
Hemoglobin: 12.2 g/dL — ABNORMAL LOW (ref 13.0–17.0)
MCH: 32.5 pg (ref 26.0–34.0)
MCHC: 33.3 g/dL (ref 30.0–36.0)
MCV: 97.6 fL (ref 78.0–100.0)

## 2011-08-04 MED ORDER — FAMOTIDINE 20 MG PO TABS: 20.0000 mg | ORAL_TABLET | Freq: Two times a day (BID) | ORAL | Status: DC

## 2011-08-04 MED ORDER — IOHEXOL 300 MG/ML  SOLN
100.0000 mL | Freq: Once | INTRAMUSCULAR | Status: AC | PRN
  Administered 2011-08-04: 100 mL via INTRAVENOUS

## 2011-08-04 MED ORDER — ACETAMINOPHEN 325 MG PO TABS
650.0000 mg | ORAL_TABLET | Freq: Four times a day (QID) | ORAL | Status: DC | PRN
  Administered 2011-08-04 – 2011-08-09 (×5): 650 mg via ORAL
  Filled 2011-08-04 (×6): qty 2

## 2011-08-04 MED ORDER — PANTOPRAZOLE SODIUM 40 MG IV SOLR
40.0000 mg | INTRAVENOUS | Status: DC
  Administered 2011-08-04 – 2011-08-07 (×4): 40 mg via INTRAVENOUS
  Filled 2011-08-04 (×3): qty 40

## 2011-08-04 MED ORDER — SODIUM CHLORIDE 0.9 % IJ SOLN
INTRAMUSCULAR | Status: AC
  Filled 2011-08-04: qty 10

## 2011-08-04 MED ORDER — FAMOTIDINE 20 MG PO TABS
20.0000 mg | ORAL_TABLET | Freq: Two times a day (BID) | ORAL | Status: DC
  Administered 2011-08-04 (×2): 20 mg via ORAL
  Filled 2011-08-04 (×2): qty 1

## 2011-08-04 MED ORDER — ACETAMINOPHEN 10 MG/ML IV SOLN
INTRAVENOUS | Status: AC
  Filled 2011-08-04: qty 100

## 2011-08-04 MED ORDER — OXYCODONE-ACETAMINOPHEN 5-325 MG PO TABS: 1.0000 | ORAL_TABLET | ORAL | Status: DC | PRN

## 2011-08-04 NOTE — Progress Notes (Signed)
Subjective: Plan was to be discharged today however had a fever of 102F and hence will observe today. Otherwise overall reports that his pain is better, epigastric pain has resolved and he is having mild lower quadrant pain. Able to tolerate clear liquids.   Objective: Vital signs in last 24 hours: Temp:  [99.2 F (37.3 C)-102.9 F (39.4 C)] 99.8 F (37.7 C) (10/11 1255) Pulse Rate:  [70-120] 107  (10/11 1255) Resp:  [18-20] 18  (10/11 1255) BP: (119-152)/(78-98) 119/78 mmHg (10/11 1255) SpO2:  [85 %-96 %] 94 % (10/11 1255) Weight change:  Last BM Date: 08/04/11  Intake/Output from previous day: 10/10 0701 - 10/11 0700 In: 4489 [P.O.:600; I.V.:3739; IV Piggyback:150] Out: 900 [Urine:900] Total I/O In: 360 [P.O.:360] Out: 325 [Urine:325]   Physical Exam: General: Alert, awake, oriented x3, in no acute distress. HEENT: No bruits, no goiter. Heart: Regular rate and rhythm, without murmurs, rubs, gallops. Lungs: Clear to auscultation bilaterally. Abdomen: Soft, no epigastric tenderness anymore, mild bilateral lower quadrant tenderness no rigidity or rebound, nondistended, positive bowel sounds. Extremities: No clubbing cyanosis or edema with positive pedal pulses. Neuro: Grossly intact, nonfocal.    Lab Results: Basic Metabolic Panel:  Basename 08/04/11 0456 08/03/11 0457  NA 135 138  K 4.1 4.0  CL 104 102  CO2 22 24  GLUCOSE 107* 120*  BUN 11 9  CREATININE 1.31 1.04  CALCIUM 7.9* 8.6  MG -- 2.1  PHOS -- 2.9   Liver Function Tests:  [facility] 08/02/11 2111  AST 25  ALT 25  ALKPHOS 86  BILITOT 1.0  PROT 8.2  ALBUMIN 4.1    Basename 08/03/11 0457 08/02/11 2111  LIPASE 772* 1768*  AMYLASE -- --   No results found for this basename: AMMONIA:2 in the last 72 hours CBC:  Basename 08/04/11 0456 08/03/11 0457  WBC 15.7* 17.2*  NEUTROABS -- --  HGB 12.2* 13.0  HCT 36.6* 39.6  MCV 97.6 95.9  PLT 167 192   Cardiac Enzymes: No results found for this  basename: CKTOTAL:3,CKMB:3,CKMBINDEX:3,TROPONINI:3 in the last 72 hours BNP: No results found for this basename: POCBNP:3 in the last 72 hours D-Dimer: No results found for this basename: DDIMER:2 in the last 72 hours CBG:  Basename 08/04/11 1118 08/04/11 0746 08/04/11 0402 08/03/11 2356 08/03/11 1956 08/03/11 1653  GLUCAP 105* 98 111* 110* 145* 131*   Hemoglobin A1C:  Basename 08/03/11 0147  HGBA1C 5.7*   Fasting Lipid Panel:  Basename 08/02/11 2235  CHOL 173  HDL 93  LDLCALC 70  TRIG 51  CHOLHDL 1.9  LDLDIRECT --   Thyroid Function Tests: No results found for this basename: TSH,T4TOTAL,FREET4,T3FREE,THYROIDAB in the last 72 hours Anemia Panel: No results found for this basename: VITAMINB12,FOLATE,FERRITIN,TIBC,IRON,RETICCTPCT in the last 72 hours Urine Drug Screen:  Alcohol Level: No results found for this basename: ETH:2 in the last 72 hours Urinalysis:  Misc. Labs:  No results found for this or any previous visit (from the past 240 hour(s)).  Studies/Results: Dg Chest 2 View  08/02/2011  *RADIOLOGY REPORT*  Clinical Data: Sternal and epigastric pain.  Fever.  CHEST - 2 VIEW  Comparison: None  Findings: Artifact overlies the chest.  Heart size is normal. Mediastinal shadows are normal.  The lungs are clear.  No effusions.  No bony abnormalities.  IMPRESSION: No active disease  Original Report Authenticated By: Thomasenia Sales, M.D.   US Abdomen Limited Ruq  08/03/2011  *RADIOLOGY REPORT*  Clinical Data:  Abdominal pain, pancreatitis.  Question of  gallstones.  LIMITED ABDOMINAL ULTRASOUND - RIGHT UPPER QUADRANT  Comparison:  None.  Findings:  Gallbladder:  No gallstones, gallbladder wall thickening, or pericholecystic fluid.  Common bile duct:  5 mm.  Normal.  Liver:  The hepatic parenchyma is mildly diffusely decreased in echogenicity, with prominence of the echogenic portal triads.  No focal lesion is identified.  Trace perihepatic ascites is present.  IMPRESSION: No  gallstone or other secondary evidence for acute cholecystitis is identified.  Hypoechogenicity of the liver is nonspecific but may be seen with liver disease such as hepatitis.  Trace perihepatic ascites.  Original Report Authenticated By: Harrel Lemon, M.D.    Medications: Scheduled Meds:   . amLODipine  10 mg Oral Daily  . antiseptic oral rinse   Mouth Rinse BID  . atenolol  50 mg Oral QAM  . enoxaparin  40 mg Subcutaneous Q24H  . famotidine  20 mg Oral BID  . insulin aspart  0-9 Units Subcutaneous Q4H  . LORazepam  0-4 mg Intravenous Q6H   Followed by  . LORazepam  0-4 mg Intravenous Q12H  . multivitamins ther. w/minerals  1 tablet Oral Daily  . sodium chloride      . vitamin B-1  100 mg Oral Daily  . DISCONTD: famotidine (PEPCID) IV  20 mg Intravenous Q12H  . DISCONTD: thiamine  100 mg Intravenous Daily   Continuous Infusions:   . sodium chloride 0.45 % 1,000 mL with potassium chloride 20 mEq infusion 100 mL/hr at 08/04/11 0642   PRN Meds:.acetaminophen, acetaminophen, bisacodyl, HYDROmorphone, LORazepam, LORazepam, ondansetron (ZOFRAN) IV, ondansetron, sodium phosphate  Assessment/Plan:  Principal Problem:  *Acute alcoholic pancreatitis: Will advance to a full liquid diet continue IV fluids and narcotics and antiemetics. If fevers persist will get a CT of his abdomen pelvis to rule out pancreatic necrosis, any other infectious etiology. Active Problems:  HTN (hypertension)  Hyperglycemia: Likely secondary to pancreatitis hemoglobin A1c is less than 6  Hypokalemia with normal acid-base balance  Alcohol abuse  Fever: Likely inflammatory secondary pancreatitis it does persist we'll get a CT abdomen pelvis to rule out pancreatic necrosis and or any other infectious process.    LOS: 2 days   Geoffrey Conner 08/04/2011, 1:01 PM

## 2011-08-04 NOTE — Discharge Summary (Addendum)
Cancelled.  

## 2011-08-05 LAB — CBC
HCT: 38.5 % — ABNORMAL LOW (ref 39.0–52.0)
Hemoglobin: 12.6 g/dL — ABNORMAL LOW (ref 13.0–17.0)
MCHC: 32.7 g/dL (ref 30.0–36.0)
RBC: 3.94 MIL/uL — ABNORMAL LOW (ref 4.22–5.81)

## 2011-08-05 LAB — GLUCOSE, CAPILLARY
Glucose-Capillary: 104 mg/dL — ABNORMAL HIGH (ref 70–99)
Glucose-Capillary: 108 mg/dL — ABNORMAL HIGH (ref 70–99)
Glucose-Capillary: 108 mg/dL — ABNORMAL HIGH (ref 70–99)
Glucose-Capillary: 110 mg/dL — ABNORMAL HIGH (ref 70–99)
Glucose-Capillary: 116 mg/dL — ABNORMAL HIGH (ref 70–99)

## 2011-08-05 LAB — HEPATIC FUNCTION PANEL
Albumin: 2.5 g/dL — ABNORMAL LOW (ref 3.5–5.2)
Indirect Bilirubin: 0.3 mg/dL (ref 0.3–0.9)
Total Protein: 6.4 g/dL (ref 6.0–8.3)

## 2011-08-05 LAB — BASIC METABOLIC PANEL
BUN: 13 mg/dL (ref 6–23)
CO2: 20 mEq/L (ref 19–32)
GFR calc non Af Amer: 66 mL/min — ABNORMAL LOW (ref >90)
Glucose, Bld: 117 mg/dL — ABNORMAL HIGH (ref 70–99)
Potassium: 3.9 mEq/L (ref 3.5–5.1)
Sodium: 131 mEq/L — ABNORMAL LOW (ref 135–145)

## 2011-08-05 MED ORDER — CEFTAZIDIME 1 G IJ SOLR: 1.0000 g | Freq: Three times a day (TID) | INTRAMUSCULAR | Status: DC

## 2011-08-05 MED ORDER — DEXTROSE 5 % IV SOLN
1.0000 g | Freq: Three times a day (TID) | INTRAVENOUS | Status: DC
  Administered 2011-08-05 – 2011-08-11 (×17): 1 g via INTRAVENOUS
  Filled 2011-08-05 (×19): qty 1

## 2011-08-05 MED ORDER — POLYVINYL ALCOHOL 1.4 % OP SOLN
1.0000 [drp] | OPHTHALMIC | Status: DC | PRN
  Administered 2011-08-05: 1 [drp] via OPHTHALMIC
  Filled 2011-08-05: qty 15

## 2011-08-05 MED ORDER — DEXTROSE 5 % IV SOLN
INTRAVENOUS | Status: AC
  Filled 2011-08-05 (×2): qty 1

## 2011-08-05 MED ORDER — SODIUM CHLORIDE 0.9 % IV SOLN
500.0000 mg | Freq: Three times a day (TID) | INTRAVENOUS | Status: DC
  Administered 2011-08-05: 500 mg via INTRAVENOUS
  Filled 2011-08-05 (×10): qty 500

## 2011-08-05 NOTE — Progress Notes (Addendum)
Subjective: fevers overnight MAXIMUM TEMPERATURE is 102.7,   Objective: Vital signs in last 24 hours: Temp:  [98.5 F (36.9 C)-102.7 F (39.3 C)] 98.5 F (36.9 C) (10/12 0625) Pulse Rate:  [96-113] 96  (10/12 0625) Resp:  [16-19] 18  (10/12 0625) BP: (119-152)/(78-98) 119/78 mmHg (10/12 0625) SpO2:  [87 %-95 %] 95 % (10/12 0807) Weight change:  Last BM Date: 08/04/11  Intake/Output from previous day: 10/11 0701 - 10/12 0700 In: 2709 [P.O.:360; I.V.:2349] Out: 325 [Urine:325]     Physical Exam: General: Alert, awake, oriented x3, in no acute distress. HEENT: No bruits, no goiter. Heart: Regular rate and rhythm, without murmurs, rubs, gallops. Lungs: Clear to auscultation bilaterally. Abdomen: Firm slightly distended, mild peri-umbilical tenderness, no rigidity,  mild rebound, positive bowel sounds. Extremities: No clubbing cyanosis or edema with positive pedal pulses. Neuro: Grossly intact, nonfocal.    Lab Results: Basic Metabolic Panel:  Basename 08/05/11 0523 08/04/11 0456 08/03/11 0457  NA 131* 135 --  K 3.9 4.1 --  CL 102 104 --  CO2 20 22 --  GLUCOSE 117* 107* --  BUN 13 11 --  CREATININE 1.38* 1.31 --  CALCIUM 8.2* 7.9* --  MG -- -- 2.1  PHOS -- -- 2.9   Liver Function Tests:  [facility] 08/05/11 0523 08/02/11 2111  AST 17 25  ALT 11 25  ALKPHOS 113 86  BILITOT 0.4 1.0  PROT 6.4 8.2  ALBUMIN 2.5* 4.1    Basename 08/05/11 0523 08/03/11 0457  LIPASE 80* 772*  AMYLASE -- --   No results found for this basename: AMMONIA:2 in the last 72 hours CBC:  Basename 08/05/11 0523 08/04/11 0456  WBC 13.4* 15.7*  NEUTROABS -- --  HGB 12.6* 12.2*  HCT 38.5* 36.6*  MCV 97.7 97.6  PLT 213 167   Cardiac Enzymes: No results found for this basename: CKTOTAL:3,CKMB:3,CKMBINDEX:3,TROPONINI:3 in the last 72 hours BNP: No results found for this basename: POCBNP:3 in the last 72 hours D-Dimer: No results found for this basename: DDIMER:2 in the last 72  hours CBG:  Basename 08/05/11 0723 08/05/11 0402 08/05/11 0007 08/04/11 2040 08/04/11 1628 08/04/11 1118  GLUCAP 108* 116* 108* 131* 130* 105*   Hemoglobin A1C:  Basename 08/04/11 0456  HGBA1C 5.4   Fasting Lipid Panel:  Basename 08/02/11 2235  CHOL 173  HDL 93  LDLCALC 70  TRIG 51  CHOLHDL 1.9  LDLDIRECT --   Thyroid Function Tests: No results found for this basename: TSH,T4TOTAL,FREET4,T3FREE,THYROIDAB in the last 72 hours Anemia Panel: No results found for this basename: VITAMINB12,FOLATE,FERRITIN,TIBC,IRON,RETICCTPCT in the last 72 hours Urine Drug Screen:  Alcohol Level: No results found for this basename: ETH:2 in the last 72 hours Urinalysis:  Misc. Labs:  No results found for this or any previous visit (from the past 240 hour(s)).  Studies/Results: Ct Abdomen Pelvis W Contrast  08/04/2011  *RADIOLOGY REPORT*  Clinical Data:  Fever, leukocytosis, acute pancreatitis, history hypertension  CT ABDOMEN AND PELVIS WITH CONTRAST  Technique:  Multidetector CT imaging of the abdomen and pelvis was performed following the standard protocol during bolus administration of intravenous contrast. Sagittal and coronal MPR images reconstructed from axial data set.  Contrast: OMNIPAQUE IOHEXOL 300 MG/ML IV SOLN; Dilute oral contrast.  Comparison: None.  Findings: Bibasilar effusions and atelectasis. Retained contrast and air in a mildly dilated distal esophagus. Liver, spleen, kidneys, and adrenal glands normal appearance. Normal appearance of pancreatic head and body.  Markedly enlarged and edematous pancreatic tail with extensive edema  in the pancreatic bed and left anterior pararenal space, extending to the lateral coronal fascia, compatible with acute pancreatitis. No definite pancreatic hemorrhage. Portions of the pancreatic tail are low in attenuation, which could be related to edema but making it difficult to completely exclude small areas of necrosis. Splenic vein is  attenuated but patent. Portal and superior mesenteric veins patent.  Ascites in upper abdomen, both gutters, and in pelvis. Marked bowel wall edema of mildly dilated small bowel loops in the right mid abdomen, distal ileal, with associated edema of the mesentery. Proximal small bowel loops demonstrate normal wall thickness and are non dilated. Colon is fluid-filled with questionable mild wall thickening of the descending colon, suspect related to regional inflammation / pancreatitis. Remaining colon demonstrates normal wall thickness. Unremarkable bladder and prostate gland. No definite mass, adenopathy, or free air. Normal appendix. No acute osseous findings.  IMPRESSION: Severe distal pancreatitis with extensive regional inflammatory changes of the pancreatic bed and left anterior pararenal space. Severe distal ileitis of uncertain etiology. This could be related to pancreatitis, infectious enteritis, inflammatory bowel disease.  No gross evidence of mesenteric thrombosis or pseudocyst formation. Mild to moderate ascites. Bibasilar effusions and atelectasis.  Original Report Authenticated By: Lollie Marrow, M.D.   US Abdomen Limited Ruq  08/03/2011  *RADIOLOGY REPORT*  Clinical Data:  Abdominal pain, pancreatitis.  Question of gallstones.  LIMITED ABDOMINAL ULTRASOUND - RIGHT UPPER QUADRANT  Comparison:  None.  Findings:  Gallbladder:  No gallstones, gallbladder wall thickening, or pericholecystic fluid.  Common bile duct:  5 mm.  Normal.  Liver:  The hepatic parenchyma is mildly diffusely decreased in echogenicity, with prominence of the echogenic portal triads.  No focal lesion is identified.  Trace perihepatic ascites is present.  IMPRESSION: No gallstone or other secondary evidence for acute cholecystitis is identified.  Hypoechogenicity of the liver is nonspecific but may be seen with liver disease such as hepatitis.  Trace perihepatic ascites.  Original Report Authenticated By: Harrel Lemon, M.D.     Medications: Scheduled Meds:   . antiseptic oral rinse   Mouth Rinse BID  . atenolol  50 mg Oral QAM  . enoxaparin  40 mg Subcutaneous Q24H  . insulin aspart  0-9 Units Subcutaneous Q4H  . multivitamins ther. w/minerals  1 tablet Oral Daily  . pantoprazole (PROTONIX) IV  40 mg Intravenous Q24H  . sodium chloride      . vitamin B-1  100 mg Oral Daily  . DISCONTD: amLODipine  10 mg Oral Daily  . DISCONTD: famotidine  20 mg Oral BID  . DISCONTD: LORazepam  0-4 mg Intravenous Q6H  . DISCONTD: LORazepam  0-4 mg Intravenous Q12H  . DISCONTD: thiamine  100 mg Intravenous Daily   Continuous Infusions:   . sodium chloride 0.45 % 1,000 mL with potassium chloride 20 mEq infusion 150 mL/hr at 08/05/11 0813   PRN Meds:.acetaminophen, bisacodyl, HYDROmorphone, iohexol, LORazepam, LORazepam, ondansetron (ZOFRAN) IV, ondansetron, sodium phosphate  Assessment/Plan: Principal Problem:  1. Severe pancreatitis most likely alcohol induced : We'll change diet from clears to n.p.o., Start IV Primaxin,  request GI consultation , continue IV fluids and narcotics and antiemetics, if unable to start diet by tomorrow we'll consider NJ tube or parenteral nutrition.  2. leukocytosis secondary to pancreatitis, start Primaxin , Check CBC in a.m. 3.HTN (hypertension): Stable  4.Hyperglycemia: Likely secondary to pancreatitis hemoglobin A1c is less than 6  5. Alcohol abuse : No signs and symptoms of withdrawal     LOS:  3 days   Geoffrey Conner 08/05/2011, 9:32 AM

## 2011-08-05 NOTE — Progress Notes (Signed)
Called by nursing, patient refusing Primaxin - states it causes itching. Primaxin d/ced, ceftazidime started.

## 2011-08-05 NOTE — Consult Note (Signed)
ANTIBIOTIC CONSULT NOTE - INITIAL  Pharmacy Consult for Primaxin Indication: febrile illness  Allergies  Allergen Reactions  . Peanut-Containing Drug Products Shortness Of Breath and Swelling   Patient Measurements: Height: 5\' 11"  (180.3 cm) Weight: 175 lb 11.3 oz (79.7 kg) IBW/kg (Calculated) : 75.3   Vital Signs: Temp: 98.7 F (37.1 C) (10/12 1000) Temp src: Oral (10/12 1000) BP: 110/72 mmHg (10/12 1000) Pulse Rate: 95  (10/12 1000) Intake/Output from previous day: 10/11 0701 - 10/12 0700 In: 2709 [P.O.:360; I.V.:2349] Out: 325 [Urine:325] Intake/Output from this shift: Total I/O In: 120 [P.O.:120] Out: -   Labs:  Basename 08/05/11 0523 08/04/11 0456 08/03/11 0457  WBC 13.4* 15.7* 17.2*  HGB 12.6* 12.2* 13.0  PLT 213 167 192  LABCREA -- -- --  CREATININE 1.38* 1.31 1.04   Estimated Creatinine Clearance: 81.1 ml/min (by C-G formula based on Cr of 1.38). No results found for this basename: VANCOTROUGH:2,VANCOPEAK:2,VANCORANDOM:2,GENTTROUGH:2,GENTPEAK:2,GENTRANDOM:2,TOBRATROUGH:2,TOBRAPEAK:2,TOBRARND:2,AMIKACINPEAK:2,AMIKACINTROU:2,AMIKACIN:2, in the last 72 hours   Microbiology: No results found for this or any previous visit (from the past 720 hour(s)).  Medical History: Past Medical History  Diagnosis Date  . Hypertension    Medications:  Scheduled:    . antiseptic oral rinse   Mouth Rinse BID  . atenolol  50 mg Oral QAM  . enoxaparin  40 mg Subcutaneous Q24H  . imipenem-cilastatin  500 mg Intravenous Q8H  . insulin aspart  0-9 Units Subcutaneous Q4H  . multivitamins ther. w/minerals  1 tablet Oral Daily  . pantoprazole (PROTONIX) IV  40 mg Intravenous Q24H  . sodium chloride      . vitamin B-1  100 mg Oral Daily  . DISCONTD: amLODipine  10 mg Oral Daily  . DISCONTD: famotidine  20 mg Oral BID  . DISCONTD: LORazepam  0-4 mg Intravenous Q6H  . DISCONTD: LORazepam  0-4 mg Intravenous Q12H   Assessment: Good renal fxn fevers  Goal of Therapy:    Eradicate infection.  Plan: Primaxin 500mg  iv q8hrs Monitor labs per protocol  Valrie Hart A 08/05/2011,11:16 AM

## 2011-08-06 DIAGNOSIS — K859 Acute pancreatitis without necrosis or infection, unspecified: Secondary | ICD-10-CM

## 2011-08-06 LAB — CBC
HCT: 34.4 % — ABNORMAL LOW (ref 39.0–52.0)
MCH: 32.4 pg (ref 26.0–34.0)
MCHC: 33.7 g/dL (ref 30.0–36.0)
MCV: 96.1 fL (ref 78.0–100.0)
Platelets: 239 10*3/uL (ref 150–400)
RDW: 13.2 % (ref 11.5–15.5)
WBC: 8.5 10*3/uL (ref 4.0–10.5)

## 2011-08-06 LAB — GLUCOSE, CAPILLARY
Glucose-Capillary: 78 mg/dL (ref 70–99)
Glucose-Capillary: 80 mg/dL (ref 70–99)

## 2011-08-06 LAB — BASIC METABOLIC PANEL
BUN: 11 mg/dL (ref 6–23)
Calcium: 8.3 mg/dL — ABNORMAL LOW (ref 8.4–10.5)
Creatinine, Ser: 1.27 mg/dL (ref 0.50–1.35)
GFR calc Af Amer: 85 mL/min — ABNORMAL LOW (ref >90)
GFR calc non Af Amer: 73 mL/min — ABNORMAL LOW (ref >90)

## 2011-08-06 MED ORDER — CEFTAZIDIME 1 G IJ SOLR
INTRAMUSCULAR | Status: AC
  Filled 2011-08-06 (×2): qty 1

## 2011-08-06 NOTE — Consult Note (Signed)
Referring Provider: No ref. provider found Primary Care Physician:  No primary provider on file. Primary Gastroenterologist:  Dr. Jena Gauss  Reason for Consultation:  Pancreatitis with fever.  HPI:    A 34 year old African American male with heavy long-standing alcohol abuse admitted to the hospital 08/02/2011 with acute onset of abdominal pain and a diagnosis of pancreatitis. His lipase was markedly elevated on admission. A contrast CT revealed significant inflammatory changes more towards the tail of the pancreas consistent with acute pancreatitis. Contrast enhancement demonstrated a viable pancreas. He has been treated with aggressive hydration, n.p.o. and with analgesic therapy in the way of dilaudid.  No hemoconcentration; BUN and creatinine had remained normal. He remains tachycardic. He did develop a temperature over the past 24 hours. Antibiotics have been added to his regimen.  His pain is lessening to what he describes as 4-5/10. He is taking now taking Dilaudid about once every 5 hours according to the nursing staff. Transabdominal ultrasound negative for stone disease. Triglyceride level is 51.  No prior episodes of pancreatitis.  Past Medical History  Diagnosis Date  . Hypertension     Past Surgical History  Procedure Date  . No past surgeries     Prior to Admission medications   Medication Sig Start Date End Date Taking? Authorizing Provider  amLODipine (NORVASC) 10 MG tablet Take 10 mg by mouth daily.     Yes Historical Provider, MD  atenolol (TENORMIN) 50 MG tablet Take 50 mg by mouth every morning.     Yes Historical Provider, MD  famotidine (PEPCID) 20 MG tablet Take 1 tablet (20 mg total) by mouth 2 (two) times daily. 08/04/11 08/03/12  Zannie Cove  oxyCODONE-acetaminophen (ROXICET) 5-325 MG per tablet Take 1 tablet by mouth every 4 (four) hours as needed for pain. 08/04/11 08/14/11  Zannie Cove    Current Facility-Administered Medications  Medication Dose Route  Frequency Provider Last Rate Last Dose  . acetaminophen (TYLENOL) tablet 650 mg  650 mg Oral Q6H PRN Preetha Joseph   650 mg at 08/05/11 2056  . antiseptic oral rinse (BIOTENE) solution   Mouth Rinse BID Vania Rea      . atenolol (TENORMIN) tablet 50 mg  50 mg Oral QAM Preetha Joseph   50 mg at 08/06/11 1610  . bisacodyl (DULCOLAX) suppository 10 mg  10 mg Rectal Q48H PRN Vania Rea      . cefTAZidime (FORTAZ) 1 g in dextrose 5 % 50 mL IVPB  1 g Intravenous Q8H Debby Crosley, MD   1 g at 08/06/11 0622  . enoxaparin (LOVENOX) injection 40 mg  40 mg Subcutaneous Q24H Leopold Campbell   40 mg at 08/06/11 0731  . HYDROmorphone (DILAUDID) injection 1-2 mg  1-2 mg Intravenous Q2H PRN Vania Rea   2 mg at 08/06/11 1205  . insulin aspart (novoLOG) injection 0-9 Units  0-9 Units Subcutaneous Q4H Vania Rea   1 Units at 08/06/11 0118  . LORazepam (ATIVAN) tablet 1 mg  1 mg Oral Q6H PRN Vania Rea       Or  . LORazepam (ATIVAN) 2 MG/ML injection 1 mg  1 mg Intravenous Q6H PRN Vania Rea      . multivitamins ther. w/minerals tablet 1 tablet  1 tablet Oral Daily Vania Rea   1 tablet at 08/06/11 0903  . ondansetron (ZOFRAN) tablet 4 mg  4 mg Oral Q6H PRN Vania Rea       Or  . ondansetron [facility]) injection 4 mg  4 mg Intravenous  Q6H PRN Vania Rea      . pantoprazole (PROTONIX) injection 40 mg  40 mg Intravenous Q24H Preetha Joseph   40 mg at 08/05/11 1600  . polyvinyl alcohol (LIQUIFILM TEARS) 1.4 % ophthalmic solution 1 drop  1 drop Both Eyes PRN Zannie Cove   1 drop at 08/05/11 1424  . sodium chloride 0.45 % 1,000 mL with potassium chloride 20 mEq infusion   Intravenous Continuous Preetha Joseph 150 mL/hr at 08/05/11 2355    . sodium phosphate (FLEET) 7-19 GM/118ML enema 1 enema  1 enema Rectal Q48H PRN Vania Rea      . vitamin B-1 tablet 100 mg  100 mg Oral Daily Leopold Campbell   100 mg at 08/06/11 0903  . DISCONTD: cefTAZidime (FORTAZ)  injection 1 g  1 g Intramuscular Q8H Debby Crosley, MD      . DISCONTD: imipenem-cilastatin (PRIMAXIN) 500 mg in sodium chloride 0.9 % 100 mL IVPB  500 mg Intravenous Q8H Scott A Hall, PHARMD   500 mg at 08/05/11 1423    Allergies as of 08/02/2011 - Review Complete 08/02/2011  Allergen Reaction Noted  . Peanut-containing drug products Shortness Of Breath and Swelling 08/02/2011    Family History  Problem Relation Age of Onset  . Hypertension Mother   . Hypertension Father   . Cancer Mother     breast 9 yrs ago    History   Social History  . Marital Status: Single    Spouse Name: N/A    Number of Children: N/A  . Years of Education: N/A   Occupational History  . Not on file.   Social History Main Topics  . Smoking status: Former Games developer  . Smokeless tobacco: Not on file  . Alcohol Use: 3.0 oz/week    5 Shots of liquor per week     3-4 fifths of vodka per week with friends; 2 beers/day  . Drug Use: No  . Sexually Active: Yes    Birth Control/ Protection: Condom   Other Topics Concern  . Not on file   Social History Narrative  . No narrative on file    Review of Systems:   CV: Denies chest pain, angina, palpitations, syncope, orthopnea, PND, peripheral edema, and claudication. Resp: Denies dyspnea at rest, dyspnea with exercise, cough, sputum, wheezing, coughing up blood, and pleurisy. GI: Denies vomiting blood, jaundice, and fecal incontinence.   Denies dysphagia or odynophagia. Derm: Denies rash, itching, dry skin, hives, moles, warts, or unhealing ulcers.  Psych: Denies depression, anxiety, memory loss, suicidal ideation, hallucinations, paranoia, and confusion. Heme: Denies bruising, bleeding, and enlarged lymph nodes.   Physical Exam: Vital signs in last 24 hours: Temp:  [98.8 F (37.1 C)-99.7 F (37.6 C)] 98.8 F (37.1 C) (10/13 0416) Pulse Rate:  [96-114] 98  (10/13 0416) Resp:  [18-20] 18  (10/13 0416) BP: (123-142)/(83-93) 142/93 mmHg (10/13  0416) SpO2:  [94 %-98 %] 98 % (10/13 0416) Weight:  [184 lb 8.4 oz (83.7 kg)] 184 lb 8.4 oz (83.7 kg) (10/13 0416) Last BM Date: 08/05/11 General:   Alert,  Well-developed, well-nourished, pleasant and cooperative in NAD.  Accompanied by multiple family members. Head:  Normocephalic and atraumatic. Eyes:  Sclera clear, no icterus.   Conjunctiva pink. Ears:  Normal auditory acuity. Nose:  No deformity, discharge,  or lesions. Mouth:  No deformity or lesions, dentition normal. Neck:  Supple; no masses or thyromegaly. Lungs:  Clear throughout to auscultation.   No wheezes, crackles, or rhonchi. No acute  distress. Heart:  Regular rate and rhythm; no murmurs, clicks, rubs,  or gallops. Abdomen:  Full. Bowel sounds present but infrequent;  he has really minimal tenderness to deep palpation;  no appreciable mass or organomegaly  Pulses:  Normal pulses noted. Extremities:  Without clubbing or edema. Skin:  Intact without significant lesions or rashes. Cervical Nodes:  No significant cervical adenopathy. Psych:  Alert and cooperative. Normal mood and affect.  Intake/Output from previous day: 10/12 0701 - 10/13 0700 In: 3909.2 [P.O.:120; I.V.:3589.2; IV Piggyback:200] Out: -  Intake/Output this shift:    Lab Results:  Basename 08/06/11 0735 08/05/11 0523 08/04/11 0456  WBC 8.5 13.4* 15.7*  HGB 11.6* 12.6* 12.2*  HCT 34.4* 38.5* 36.6*  PLT 239 213 167   BMET  Basename 08/06/11 0735 08/05/11 0523 08/04/11 0456  NA 132* 131* 135  K 3.8 3.9 4.1  CL 101 102 104  CO2 18* 20 22  GLUCOSE 101* 117* 107*  BUN 11 13 11   CREATININE 1.27 1.38* 1.31  CALCIUM 8.3* 8.2* 7.9*   LFT  Basename 08/05/11 0523  PROT 6.4  ALBUMIN 2.5*  AST 17  ALT 11  ALKPHOS 113  BILITOT 0.4  BILIDIR 0.1  IBILI 0.3   PT/INR No results found for this basename: LABPROT:2,INR:2 in the last 72 hours Hepatitis Panel No results found for this basename: HEPBSAG,HCVAB,HEPAIGM,HEPBIGM in the last 72  hours C-Diff No results found for this basename: CDIFFTOX:3 in the last 72 hours  Studies/Results: Ct Abdomen Pelvis W Contrast  08/04/2011  *RADIOLOGY REPORT*  Clinical Data:  Fever, leukocytosis, acute pancreatitis, history hypertension  CT ABDOMEN AND PELVIS WITH CONTRAST  Technique:  Multidetector CT imaging of the abdomen and pelvis was performed following the standard protocol during bolus administration of intravenous contrast. Sagittal and coronal MPR images reconstructed from axial data set.  Contrast: OMNIPAQUE IOHEXOL 300 MG/ML IV SOLN; Dilute oral contrast.  Comparison: None.  Findings: Bibasilar effusions and atelectasis. Retained contrast and air in a mildly dilated distal esophagus. Liver, spleen, kidneys, and adrenal glands normal appearance. Normal appearance of pancreatic head and body.  Markedly enlarged and edematous pancreatic tail with extensive edema in the pancreatic bed and left anterior pararenal space, extending to the lateral coronal fascia, compatible with acute pancreatitis. No definite pancreatic hemorrhage. Portions of the pancreatic tail are low in attenuation, which could be related to edema but making it difficult to completely exclude small areas of necrosis. Splenic vein is attenuated but patent. Portal and superior mesenteric veins patent.  Ascites in upper abdomen, both gutters, and in pelvis. Marked bowel wall edema of mildly dilated small bowel loops in the right mid abdomen, distal ileal, with associated edema of the mesentery. Proximal small bowel loops demonstrate normal wall thickness and are non dilated. Colon is fluid-filled with questionable mild wall thickening of the descending colon, suspect related to regional inflammation / pancreatitis. Remaining colon demonstrates normal wall thickness. Unremarkable bladder and prostate gland. No definite mass, adenopathy, or free air. Normal appendix. No acute osseous findings.  IMPRESSION: Severe distal  pancreatitis with extensive regional inflammatory changes of the pancreatic bed and left anterior pararenal space. Severe distal ileitis of uncertain etiology. This could be related to pancreatitis, infectious enteritis, inflammatory bowel disease.  No gross evidence of mesenteric thrombosis or pseudocyst formation. Mild to moderate ascites. Bibasilar effusions and atelectasis.  Original Report Authenticated By: Lollie Marrow, M.D.    Impression:  Pleasant 35 year old gentleman admitted to the hospital with acute pancreatitis. Given  the workup thus far, almost certainly his pancreatitis is on the basis of alcohol exposure. He still has an  acutely  inflamed  pancreas but at this point in time, it appears the course is uncomplicated and slowly improving. I suspect the fever is more on the basis of the inflammatory process rather than an occult infection at this time.  He remains tachycardic.  Plan:  Agree with your appropriately aggressive IV hydration early in the course of this acute illness.    Continue NPO status  Analgesic therapy appears to be adequate with his current regimen.  I do not feel he needs antibiotics at this time.  Once his tachycardia resolves and his need for parenteral narcotics diminishes additionally, he will be ready to have his per oral diet advanced.  Would consider nasojejunal feedings with an elemental diet if additional clinical improvement is not seen in the next 24-48 hours.  I discussed the importance of alcohol cessation with the patient and accompanying family members.  I'd  like to thank the hospitalist team for allowing me to see this  gentleman today. I will be following him along with you while he is here.    LOS: 4 days   Eula Listen  08/06/2011, 12:28 PM

## 2011-08-06 NOTE — Progress Notes (Signed)
Subjective:  Pain is better, only received 1 dose of Dilaudid overnight, apparently started itching after receiving Imipenem yesterday then changed to Ceftazidime overnight.  Objective: Vital signs in last 24 hours: Temp:  [98.8 F (37.1 C)-99.7 F (37.6 C)] 98.8 F (37.1 C) (10/13 0416) Pulse Rate:  [96-114] 98  (10/13 0416) Resp:  [18-20] 18  (10/13 0416) BP: (123-142)/(83-93) 142/93 mmHg (10/13 0416) SpO2:  [94 %-98 %] 98 % (10/13 0416) Weight:  [83.7 kg (184 lb 8.4 oz)] 184 lb 8.4 oz (83.7 kg) (10/13 0416) Weight change:  Last BM Date: 08/05/11  Intake/Output from previous day: 10/12 0701 - 10/13 0700 In: 3909.2 [P.O.:120; I.V.:3589.2; IV Piggyback:200] Out: -      Physical Exam: General: Alert, awake, oriented x3, in no acute distress. HEENT: No bruits, no goiter. Heart: Regular rate and rhythm, without murmurs, rubs, gallops. Lungs: Clear to auscultation bilaterally. Abdomen: Soft, mild peri umbilical tenderness, mildly distended, positive bowel sounds. Extremities: No clubbing cyanosis or edema with positive pedal pulses. Neuro: Grossly intact, nonfocal.    Lab Results: Basic Metabolic Panel:  Basename 08/06/11 0735 08/05/11 0523  NA 132* 131*  K 3.8 3.9  CL 101 102  CO2 18* 20  GLUCOSE 101* 117*  BUN 11 13  CREATININE 1.27 1.38*  CALCIUM 8.3* 8.2*  MG -- --  PHOS -- --   Liver Function Tests:  [facility] 08/05/11 0523  AST 17  ALT 11  ALKPHOS 113  BILITOT 0.4  PROT 6.4  ALBUMIN 2.5*    Basename 08/05/11 0523  LIPASE 80*  AMYLASE --   No results found for this basename: AMMONIA:2 in the last 72 hours CBC:  Basename 08/06/11 0735 08/05/11 0523  WBC 8.5 13.4*  NEUTROABS -- --  HGB 11.6* 12.6*  HCT 34.4* 38.5*  MCV 96.1 97.7  PLT 239 213   Cardiac Enzymes: No results found for this basename: CKTOTAL:3,CKMB:3,CKMBINDEX:3,TROPONINI:3 in the last 72 hours BNP: No results found for this basename: POCBNP:3 in the last 72  hours D-Dimer: No results found for this basename: DDIMER:2 in the last 72 hours CBG:  Basename 08/06/11 0719 08/06/11 0414 08/06/11 0002 08/05/11 2030 08/05/11 1658 08/05/11 1156  GLUCAP 78 89 128* 117* 104* 110*   Hemoglobin A1C:  Basename 08/04/11 0456  HGBA1C 5.4   Fasting Lipid Panel: No results found for this basename: CHOL,HDL,LDLCALC,TRIG,CHOLHDL,LDLDIRECT in the last 72 hours Thyroid Function Tests: No results found for this basename: TSH,T4TOTAL,FREET4,T3FREE,THYROIDAB in the last 72 hours Anemia Panel: No results found for this basename: VITAMINB12,FOLATE,FERRITIN,TIBC,IRON,RETICCTPCT in the last 72 hours Urine Drug Screen:  Alcohol Level: No results found for this basename: ETH:2 in the last 72 hours Urinalysis:  Misc. Labs:  No results found for this or any previous visit (from the past 240 hour(s)).  Studies/Results: Ct Abdomen Pelvis W Contrast  08/04/2011  *RADIOLOGY REPORT*  Clinical Data:  Fever, leukocytosis, acute pancreatitis, history hypertension  CT ABDOMEN AND PELVIS WITH CONTRAST  Technique:  Multidetector CT imaging of the abdomen and pelvis was performed following the standard protocol during bolus administration of intravenous contrast. Sagittal and coronal MPR images reconstructed from axial data set.  Contrast: OMNIPAQUE IOHEXOL 300 MG/ML IV SOLN; Dilute oral contrast.  Comparison: None.  Findings: Bibasilar effusions and atelectasis. Retained contrast and air in a mildly dilated distal esophagus. Liver, spleen, kidneys, and adrenal glands normal appearance. Normal appearance of pancreatic head and body.  Markedly enlarged and edematous pancreatic tail with extensive edema in the pancreatic bed and left  anterior pararenal space, extending to the lateral coronal fascia, compatible with acute pancreatitis. No definite pancreatic hemorrhage. Portions of the pancreatic tail are low in attenuation, which could be related to edema but making it difficult  to completely exclude small areas of necrosis. Splenic vein is attenuated but patent. Portal and superior mesenteric veins patent.  Ascites in upper abdomen, both gutters, and in pelvis. Marked bowel wall edema of mildly dilated small bowel loops in the right mid abdomen, distal ileal, with associated edema of the mesentery. Proximal small bowel loops demonstrate normal wall thickness and are non dilated. Colon is fluid-filled with questionable mild wall thickening of the descending colon, suspect related to regional inflammation / pancreatitis. Remaining colon demonstrates normal wall thickness. Unremarkable bladder and prostate gland. No definite mass, adenopathy, or free air. Normal appendix. No acute osseous findings.  IMPRESSION: Severe distal pancreatitis with extensive regional inflammatory changes of the pancreatic bed and left anterior pararenal space. Severe distal ileitis of uncertain etiology. This could be related to pancreatitis, infectious enteritis, inflammatory bowel disease.  No gross evidence of mesenteric thrombosis or pseudocyst formation. Mild to moderate ascites. Bibasilar effusions and atelectasis.  Original Report Authenticated By: Lollie Marrow, M.D.    Medications: Scheduled Meds:   . antiseptic oral rinse   Mouth Rinse BID  . atenolol  50 mg Oral QAM  . cefTAZidime (FORTAZ) IV  1 g Intravenous Q8H  . enoxaparin  40 mg Subcutaneous Q24H  . insulin aspart  0-9 Units Subcutaneous Q4H  . multivitamins ther. w/minerals  1 tablet Oral Daily  . pantoprazole (PROTONIX) IV  40 mg Intravenous Q24H  . vitamin B-1  100 mg Oral Daily  . DISCONTD: cefTAZidime  1 g Intramuscular Q8H  . DISCONTD: imipenem-cilastatin  500 mg Intravenous Q8H   Continuous Infusions:   . sodium chloride 0.45 % 1,000 mL with potassium chloride 20 mEq infusion 150 mL/hr at 08/05/11 2355   PRN Meds:.acetaminophen, bisacodyl, HYDROmorphone, LORazepam, LORazepam, ondansetron (ZOFRAN) IV, ondansetron,  polyvinyl alcohol, sodium phosphate  Assessment/Plan: 1. Severe pancreatitis most likely alcohol induced : Continue NPO,  can probably start clears later today or tomorrow. Await GI consultation. Unfortunately was unable to tolerate IV Primaxin, hence changed to Ceftazidime overnight, continue for next 24-48hours, also continue IV fluids and narcotics and antiemetics, if unable to start diet by tomorrow we'll consider NJ tube or parenteral nutrition. Check TG level 2. leukocytosis secondary to pancreatitis, Improving, continue antibiotics through 10/14 atleast, afebrile now, check CBC in a.m.  3.HTN (hypertension): Stable  4.Hyperglycemia: Likely secondary to pancreatitis hemoglobin A1c is less than 6  5. Alcohol abuse : No signs and symptoms of withdrawal, counselled      LOS: 4 days   Shyne Lehrke 08/06/2011, 10:41 AM

## 2011-08-07 DIAGNOSIS — E872 Acidosis, unspecified: Secondary | ICD-10-CM | POA: Diagnosis not present

## 2011-08-07 LAB — LIPID PANEL
HDL: 27 mg/dL — ABNORMAL LOW (ref >39)
LDL Cholesterol: 110 mg/dL — ABNORMAL HIGH (ref 0–99)
Total CHOL/HDL Ratio: 6 RATIO

## 2011-08-07 LAB — BASIC METABOLIC PANEL
BUN: 10 mg/dL (ref 6–23)
Chloride: 101 mEq/L (ref 96–112)
Creatinine, Ser: 1.15 mg/dL (ref 0.50–1.35)
GFR calc Af Amer: >90 mL/min (ref >90)
GFR calc non Af Amer: 82 mL/min — ABNORMAL LOW (ref >90)
Glucose, Bld: 81 mg/dL (ref 70–99)
Potassium: 3.9 mEq/L (ref 3.5–5.1)

## 2011-08-07 LAB — GLUCOSE, CAPILLARY
Glucose-Capillary: 114 mg/dL — ABNORMAL HIGH (ref 70–99)
Glucose-Capillary: 80 mg/dL (ref 70–99)

## 2011-08-07 LAB — CBC
HCT: 33.6 % — ABNORMAL LOW (ref 39.0–52.0)
Hemoglobin: 11.3 g/dL — ABNORMAL LOW (ref 13.0–17.0)
MCHC: 33.6 g/dL (ref 30.0–36.0)
MCV: 95.7 fL (ref 78.0–100.0)
RDW: 13 % (ref 11.5–15.5)

## 2011-08-07 LAB — LACTIC ACID, PLASMA: Lactic Acid, Venous: 1 mmol/L (ref 0.5–2.2)

## 2011-08-07 MED ORDER — SODIUM CHLORIDE 0.45 % IV SOLN
INTRAVENOUS | Status: DC
  Administered 2011-08-07 – 2011-08-09 (×3): via INTRAVENOUS
  Filled 2011-08-07 (×13): qty 1000

## 2011-08-07 MED ORDER — SODIUM CHLORIDE 0.9 % IJ SOLN
INTRAMUSCULAR | Status: AC
  Administered 2011-08-07: 10 mL
  Filled 2011-08-07: qty 10

## 2011-08-07 NOTE — Progress Notes (Signed)
Subjective: Tolerating clear liquid diet. States abdominal pain has totally subsided this morning (reportedly last narcotic administration was approximately 5 hours ago)  Objective: Vital signs in last 24 hours: Temp:  [98.7 F (37.1 C)-101.7 F (38.7 C)] 99.9 F (37.7 C) (10/14 0435) Pulse Rate:  [103-110] 107  (10/14 0435) Resp:  [18] 18  (10/14 0435) BP: (150-161)/(94-98) 150/94 mmHg (10/14 0435) SpO2:  [93 %-98 %] 97 % (10/14 0435) Last BM Date: 08/06/11 General:   Alert,  Well-developed, well-nourished, pleasant and cooperative in NAD Abdomen:  Full. Positive bowel sounds soft with minimal generalized tenderness to palpation no appreciable mass organomegaly  Extremities:  Without clubbing or edema.    Intake/Output from previous day: 10/13 0701 - 10/14 0700 In: 480 [P.O.:480] Out: 500 [Urine:500] Intake/Output this shift:    Lab Results:  Basename 08/07/11 0630 08/06/11 0735 08/05/11 0523  WBC 9.1 8.5 13.4*  HGB 11.3* 11.6* 12.6*  HCT 33.6* 34.4* 38.5*  PLT 278 239 213   BMET  Basename 08/07/11 0630 08/06/11 0735 08/05/11 0523  NA 133* 132* 131*  K 3.9 3.8 3.9  CL 101 101 102  CO2 16* 18* 20  GLUCOSE 81 101* 117*  BUN 10 11 13   CREATININE 1.15 1.27 1.38*  CALCIUM 8.9 8.3* 8.2*   LFT  Basename 08/05/11 0523  PROT 6.4  ALBUMIN 2.5*  AST 17  ALT 11  ALKPHOS 113  BILITOT 0.4  BILIDIR 0.1  IBILI 0.3   Assessment:    Pancreatitis presumed secondary to alcohol. Clinically, improving. Drop in bicarbonate                             noted. Lactic acid level is pending.   Recommendations:   Continue clear liquids another 24 hours; consider advancing diet tomorrow depending                             on how he looks.     LOS: 5 days       Eula Listen  08/07/2011, 1:34 PM

## 2011-08-07 NOTE — Progress Notes (Signed)
Subjective: Denies any pain today, tolerating ice chips, wants to advance diet  Objective: Vital signs in last 24 hours: Temp:  [98.7 F (37.1 C)-101.7 F (38.7 C)] 99.9 F (37.7 C) (10/14 0435) Pulse Rate:  [103-110] 107  (10/14 0435) Resp:  [18] 18  (10/14 0435) BP: (150-161)/(94-98) 150/94 mmHg (10/14 0435) SpO2:  [93 %-98 %] 97 % (10/14 0435) Weight change:  Last BM Date: 08/06/11  Intake/Output from previous day: 10/13 0701 - 10/14 0700 In: 480 [P.O.:480] Out: 500 [Urine:500]     Physical Exam: General: Alert, awake, oriented x3, in no acute distress. HEENT: No bruits, no goiter. Heart: Regular rate and rhythm, without murmurs, rubs, gallops. Lungs: Clear to auscultation bilaterally. Abdomen: Soft, nontender, nondistended, positive bowel sounds. Extremities: No clubbing cyanosis or edema with positive pedal pulses. Neuro: Grossly intact, nonfocal.    Lab Results: Basic Metabolic Panel:  Basename 08/07/11 0630 08/06/11 0735  NA 133* 132*  K 3.9 3.8  CL 101 101  CO2 16* 18*  GLUCOSE 81 101*  BUN 10 11  CREATININE 1.15 1.27  CALCIUM 8.9 8.3*  MG -- --  PHOS -- --   Liver Function Tests:  [facility] 08/05/11 0523  AST 17  ALT 11  ALKPHOS 113  BILITOT 0.4  PROT 6.4  ALBUMIN 2.5*    Basename 08/05/11 0523  LIPASE 80*  AMYLASE --   No results found for this basename: AMMONIA:2 in the last 72 hours CBC:  Basename 08/07/11 0630 08/06/11 0735  WBC 9.1 8.5  NEUTROABS -- --  HGB 11.3* 11.6*  HCT 33.6* 34.4*  MCV 95.7 96.1  PLT 278 239   Cardiac Enzymes: No results found for this basename: CKTOTAL:3,CKMB:3,CKMBINDEX:3,TROPONINI:3 in the last 72 hours BNP: No results found for this basename: POCBNP:3 in the last 72 hours D-Dimer: No results found for this basename: DDIMER:2 in the last 72 hours CBG:  Basename 08/07/11 1243 08/07/11 1141 08/07/11 0726 08/07/11 0008 08/06/11 2012 08/06/11 1642  GLUCAP 88 50* 84 80 80 82   Hemoglobin A1C: No  results found for this basename: HGBA1C in the last 72 hours Fasting Lipid Panel:  Basename 08/07/11 0630  CHOL 163  HDL 27*  LDLCALC 110*  TRIG 129  CHOLHDL 6.0  LDLDIRECT --   Thyroid Function Tests: No results found for this basename: TSH,T4TOTAL,FREET4,T3FREE,THYROIDAB in the last 72 hours Anemia Panel: No results found for this basename: VITAMINB12,FOLATE,FERRITIN,TIBC,IRON,RETICCTPCT in the last 72 hours Urine Drug Screen:  Alcohol Level: No results found for this basename: ETH:2 in the last 72 hours Urinalysis:  Misc. Labs:  No results found for this or any previous visit (from the past 240 hour(s)).  Studies/Results: No results found.  Medications: Scheduled Meds:   . antiseptic oral rinse   Mouth Rinse BID  . atenolol  50 mg Oral QAM  . cefTAZidime (FORTAZ) IV  1 g Intravenous Q8H  . enoxaparin  40 mg Subcutaneous Q24H  . insulin aspart  0-9 Units Subcutaneous Q4H  . multivitamins ther. w/minerals  1 tablet Oral Daily  . pantoprazole (PROTONIX) IV  40 mg Intravenous Q24H  . vitamin B-1  100 mg Oral Daily   Continuous Infusions:   . sodium chloride 0.45 % 1,000 mL with potassium chloride 20 mEq, sodium bicarbonate 1 mEq/mL 100 mEq infusion    . DISCONTD: sodium chloride 0.45 % 1,000 mL with potassium chloride 20 mEq infusion 150 mL/hr at 08/07/11 0455   PRN Meds:.acetaminophen, bisacodyl, HYDROmorphone, ondansetron (ZOFRAN) IV, ondansetron, polyvinyl alcohol, sodium  phosphate  Assessment/Plan:   1. Acute alcoholic pancreatitis:  Slow improvement, tolerated ice chips over last 24 hours, appears comfortable, no pain, wants to advance diet, will do trial of clear liquids today.  Continue aggressive hydration since he is still tachycardic.  In light of bicarb trending down, will check lactic acid and cont abx for today. Will add bicarb to fluids.   2.HTN (hypertension), stable  3. Alcohol abuse: counselled  4.Fever chills:  Likely due to pancreatitis, but  cannot rule out underlying infection.   5. Acidosis, metabolic:  Will check lactic acid, cont abx, and IVF, add bicarb to IVF    LOS: 5 days   Athalie Newhard 08/07/2011, 1:37 PM

## 2011-08-08 DIAGNOSIS — R7402 Elevation of levels of lactic acid dehydrogenase (LDH): Secondary | ICD-10-CM

## 2011-08-08 DIAGNOSIS — R740 Nonspecific elevation of levels of transaminase and lactic acid dehydrogenase [LDH]: Secondary | ICD-10-CM

## 2011-08-08 DIAGNOSIS — K859 Acute pancreatitis without necrosis or infection, unspecified: Secondary | ICD-10-CM

## 2011-08-08 LAB — CBC
HCT: 29.9 % — ABNORMAL LOW (ref 39.0–52.0)
Hemoglobin: 10.2 g/dL — ABNORMAL LOW (ref 13.0–17.0)
MCH: 32.1 pg (ref 26.0–34.0)
MCHC: 34.1 g/dL (ref 30.0–36.0)
MCV: 94 fL (ref 78.0–100.0)
RDW: 13 % (ref 11.5–15.5)

## 2011-08-08 LAB — GLUCOSE, CAPILLARY
Glucose-Capillary: 129 mg/dL — ABNORMAL HIGH (ref 70–99)
Glucose-Capillary: 109 mg/dL — ABNORMAL HIGH (ref 70–99)
Glucose-Capillary: 123 mg/dL — ABNORMAL HIGH (ref 70–99)

## 2011-08-08 LAB — COMPREHENSIVE METABOLIC PANEL
ALT: 77 U/L — ABNORMAL HIGH (ref 0–53)
Albumin: 2.7 g/dL — ABNORMAL LOW (ref 3.5–5.2)
Alkaline Phosphatase: 196 U/L — ABNORMAL HIGH (ref 39–117)
Calcium: 8.4 mg/dL (ref 8.4–10.5)
Potassium: 3.4 mEq/L — ABNORMAL LOW (ref 3.5–5.1)
Sodium: 135 mEq/L (ref 135–145)
Total Protein: 6.8 g/dL (ref 6.0–8.3)

## 2011-08-08 LAB — LIPASE, BLOOD: Lipase: 117 U/L — ABNORMAL HIGH (ref 11–59)

## 2011-08-08 MED ORDER — INSULIN ASPART 100 UNIT/ML ~~LOC~~ SOLN: 0.0000 [IU] | Freq: Every day | SUBCUTANEOUS | Status: DC

## 2011-08-08 MED ORDER — INSULIN ASPART 100 UNIT/ML ~~LOC~~ SOLN
0.0000 [IU] | Freq: Three times a day (TID) | SUBCUTANEOUS | Status: DC
  Administered 2011-08-09 (×3): 1 [IU] via SUBCUTANEOUS
  Administered 2011-08-10: 2 [IU] via SUBCUTANEOUS
  Administered 2011-08-10 – 2011-08-11 (×2): 1 [IU] via SUBCUTANEOUS
  Filled 2011-08-08: qty 3

## 2011-08-08 MED ORDER — POTASSIUM CHLORIDE CRYS ER 20 MEQ PO TBCR
40.0000 meq | EXTENDED_RELEASE_TABLET | Freq: Once | ORAL | Status: AC
  Administered 2011-08-08: 40 meq via ORAL
  Filled 2011-08-08: qty 2

## 2011-08-08 MED ORDER — ENOXAPARIN SODIUM 100 MG/ML ~~LOC~~ SOLN
1.0000 mg/kg | Freq: Two times a day (BID) | SUBCUTANEOUS | Status: DC
  Administered 2011-08-08 – 2011-08-09 (×2): 85 mg via SUBCUTANEOUS
  Filled 2011-08-08 (×5): qty 1

## 2011-08-08 MED ORDER — ENOXAPARIN SODIUM 100 MG/ML ~~LOC~~ SOLN
SUBCUTANEOUS | Status: AC
  Filled 2011-08-08: qty 1

## 2011-08-08 NOTE — Progress Notes (Signed)
Pt complains of stiffness and discomfort to RLE, Pt limps while walking to bathroom. Assessed RLE, sole of foot swollen and appears slightly tight and shiney.  Md notified. Will cont to monitor.

## 2011-08-08 NOTE — Progress Notes (Signed)
Subjective: Feeling much better.  Pain 3/10 at worst to epigastric area.  Denies N/V.  Tolerating clear liquids well.  No BM yet.  Lipase 117.   AST/ALT/ALP elevated.  Bilirubin normal.  Objective: Vital signs in last 24 hours: Temp:  [99.3 F (37.4 C)-100.8 F (38.2 C)] 99.3 F (37.4 C) (10/15 0422) Pulse Rate:  [96-103] 98  (10/15 0422) Resp:  [20] 20  (10/15 0245) BP: (138-164)/(82-99) 156/97 mmHg (10/15 0422) SpO2:  [96 %-100 %] 98 % (10/15 0245) Last BM Date: 08/07/11 General:   Alert,  Well-developed, well-nourished, pleasant and cooperative in NAD. Eyes:  Sclera clear, no icterus.   Conjunctiva pink. Mouth:  No deformity or lesions,OP pink/moist. Heart:  Regular rate and rhythm; no murmurs, clicks, rubs,  or gallops. Abdomen:  Soft, nontender and nondistended. No masses, hepatosplenomegaly or hernias noted. Normal bowel sounds, without guarding, and without rebound.   Pulses:  Normal pulses noted. Extremities:  Without clubbing or edema. Neurologic:  Alert and  oriented x4;  grossly normal neurologically. Skin:  Intact without significant lesions or rashes. Psych:  Alert and cooperative. Normal mood and affect.  Lab Results:  Basename 08/08/11 0533 08/07/11 0630 08/06/11 0735  WBC 8.2 9.1 8.5  HGB 10.2* 11.3* 11.6*  HCT 29.9* 33.6* 34.4*  PLT 343 278 239   BMET  Basename 08/08/11 0533 08/07/11 0630 08/06/11 0735  NA 135 133* 132*  K 3.4* 3.9 3.8  CL 102 101 101  CO2 20 16* 18*  GLUCOSE 138* 81 101*  BUN 6 10 11   CREATININE 1.08 1.15 1.27  CALCIUM 8.4 8.9 8.3*   LFT  Basename 08/08/11 0533  PROT 6.8  ALBUMIN 2.7*  AST 76*  ALT 77*  ALKPHOS 196*  BILITOT 0.3  BILIDIR --  IBILI --   Assessment: Acute Pancreatitis suspected secondary to alcohol:  Improving.   Elevated LFTs:  Will continue to monitor.  May be secondary to alcohol induced hepatitis.  No evidence of obstruction.    Plan: Consider advancing diet later today. Continue supportive  measures. LFTs in AM.   LOS: 6 days   Lorenza Burton  08/08/2011, 7:57 AM

## 2011-08-08 NOTE — Progress Notes (Signed)
Subjective: No significant pain with clear liquids, wants to advance diet  Objective: Vital signs in last 24 hours: Temp:  [99.3 F (37.4 C)-100.8 F (38.2 C)] 99.3 F (37.4 C) (10/15 0422) Pulse Rate:  [96-103] 96  (10/15 0935) Resp:  [20] 20  (10/15 0245) BP: (138-164)/(82-99) 156/97 mmHg (10/15 0422) SpO2:  [96 %-100 %] 98 % (10/15 0935) Weight change:  Last BM Date: 08/07/11  Intake/Output from previous day: 10/14 0701 - 10/15 0700 In: 2270 [P.O.:360; I.V.:1810; IV Piggyback:100] Out: -      Physical Exam: General: Alert, awake, oriented x3, in no acute distress. HEENT: No bruits, no goiter. Heart: Regular rate and rhythm, without murmurs, rubs, gallops. Lungs: Clear to auscultation bilaterally. Abdomen: Soft, nontender, nondistended, positive bowel sounds. Extremities: No clubbing cyanosis or edema with positive pedal pulses. Neuro: Grossly intact, nonfocal.    Lab Results: Basic Metabolic Panel:  Basename 08/08/11 0533 08/07/11 0630  NA 135 133*  K 3.4* 3.9  CL 102 101  CO2 20 16*  GLUCOSE 138* 81  BUN 6 10  CREATININE 1.08 1.15  CALCIUM 8.4 8.9  MG -- --  PHOS -- --   Liver Function Tests:  [facility] 08/08/11 0533  AST 76*  ALT 77*  ALKPHOS 196*  BILITOT 0.3  PROT 6.8  ALBUMIN 2.7*    Basename 08/08/11 0533  LIPASE 117*  AMYLASE --   No results found for this basename: AMMONIA:2 in the last 72 hours CBC:  Basename 08/08/11 0533 08/07/11 0630  WBC 8.2 9.1  NEUTROABS -- --  HGB 10.2* 11.3*  HCT 29.9* 33.6*  MCV 94.0 95.7  PLT 343 278   Cardiac Enzymes: No results found for this basename: CKTOTAL:3,CKMB:3,CKMBINDEX:3,TROPONINI:3 in the last 72 hours BNP: No results found for this basename: POCBNP:3 in the last 72 hours D-Dimer: No results found for this basename: DDIMER:2 in the last 72 hours CBG:  Basename 08/08/11 1117 08/08/11 0740 08/08/11 0419 08/08/11 0027 08/07/11 2033 08/07/11 1629  GLUCAP 109* 129* 116* 100* 114* 124*     Hemoglobin A1C: No results found for this basename: HGBA1C in the last 72 hours Fasting Lipid Panel:  Basename 08/07/11 0630  CHOL 163  HDL 27*  LDLCALC 110*  TRIG 129  CHOLHDL 6.0  LDLDIRECT --   Thyroid Function Tests: No results found for this basename: TSH,T4TOTAL,FREET4,T3FREE,THYROIDAB in the last 72 hours Anemia Panel: No results found for this basename: VITAMINB12,FOLATE,FERRITIN,TIBC,IRON,RETICCTPCT in the last 72 hours  Studies/Results: No results found.  Medications: Scheduled Meds:   . antiseptic oral rinse   Mouth Rinse BID  . atenolol  50 mg Oral QAM  . cefTAZidime (FORTAZ) IV  1 g Intravenous Q8H  . enoxaparin  40 mg Subcutaneous Q24H  . insulin aspart  0-9 Units Subcutaneous Q4H  . multivitamins ther. w/minerals  1 tablet Oral Daily  . pantoprazole (PROTONIX) IV  40 mg Intravenous Q24H  . sodium chloride      . vitamin B-1  100 mg Oral Daily   Continuous Infusions:   . sodium chloride 0.45 % 1,000 mL with potassium chloride 20 mEq, sodium bicarbonate 1 mEq/mL 100 mEq infusion 150 mL/hr at 08/08/11 0317   PRN Meds:.acetaminophen, bisacodyl, HYDROmorphone, ondansetron (ZOFRAN) IV, ondansetron, polyvinyl alcohol, sodium phosphate  Assessment/Plan:  1. Acute alcoholic pancreatitis: Slow improvement, tolerated clear liquids over last 24 hours, appears comfortable, no pain, wants to advance diet, will advance diet today. Continue  hydration. 2.HTN (hypertension), stable  3. Alcohol abuse: counselled  4.Fever chills:  Likely due to pancreatitis, but cannot rule out underlying infection.  Still on antibiotics for now, continue to monitor fever curve  5. Acidosis, metabolic: resolved, lactate normal 6. Hypokalemia, replete, check magnesium    LOS: 6 days   Jadi Deyarmin 08/08/2011, 1:55 PM

## 2011-08-08 NOTE — Progress Notes (Signed)
REVIEWED.   Elevated liver enzymes, etiology unclear. CT and U/s no cholecystitis or gall stones-pancreatitis involving tail of pancreas. CHECK ACUTE HEPATITIS PANEL.  NO NV AND ABD PAIN IMPROVED-advance diet.  Ct ABD: ileitis, etiology unclear-CONSIDER RE-IMAGING IN THE FUTURE.

## 2011-08-09 ENCOUNTER — Inpatient Hospital Stay (HOSPITAL_COMMUNITY): Payer: 59

## 2011-08-09 DIAGNOSIS — M109 Gout, unspecified: Secondary | ICD-10-CM | POA: Diagnosis not present

## 2011-08-09 LAB — HEPATIC FUNCTION PANEL
ALT: 64 U/L — ABNORMAL HIGH (ref 0–53)
AST: 51 U/L — ABNORMAL HIGH (ref 0–37)
Albumin: 2.6 g/dL — ABNORMAL LOW (ref 3.5–5.2)
Alkaline Phosphatase: 171 U/L — ABNORMAL HIGH (ref 39–117)
Total Bilirubin: 0.3 mg/dL (ref 0.3–1.2)
Total Protein: 6.5 g/dL (ref 6.0–8.3)

## 2011-08-09 LAB — CBC
HCT: 29.8 % — ABNORMAL LOW (ref 39.0–52.0)
MCH: 32.4 pg (ref 26.0–34.0)
MCHC: 34.2 g/dL (ref 30.0–36.0)
MCV: 94.6 fL (ref 78.0–100.0)
Platelets: 450 10*3/uL — ABNORMAL HIGH (ref 150–400)
RDW: 13.1 % (ref 11.5–15.5)
WBC: 8.8 10*3/uL (ref 4.0–10.5)

## 2011-08-09 LAB — BASIC METABOLIC PANEL
BUN: 5 mg/dL — ABNORMAL LOW (ref 6–23)
Calcium: 8.1 mg/dL — ABNORMAL LOW (ref 8.4–10.5)
Creatinine, Ser: 1.08 mg/dL (ref 0.50–1.35)
GFR calc Af Amer: >90 mL/min (ref >90)
GFR calc non Af Amer: 89 mL/min — ABNORMAL LOW (ref >90)

## 2011-08-09 LAB — HEPATITIS PANEL, ACUTE
Hep A IgM: NEGATIVE
Hep B C IgM: NEGATIVE
Hepatitis B Surface Ag: NEGATIVE

## 2011-08-09 LAB — GLUCOSE, CAPILLARY
Glucose-Capillary: 123 mg/dL — ABNORMAL HIGH (ref 70–99)
Glucose-Capillary: 134 mg/dL — ABNORMAL HIGH (ref 70–99)

## 2011-08-09 LAB — MAGNESIUM: Magnesium: 1.9 mg/dL (ref 1.5–2.5)

## 2011-08-09 MED ORDER — INDOMETHACIN ER 75 MG PO CPCR
ORAL_CAPSULE | ORAL | Status: AC
  Filled 2011-08-09: qty 1

## 2011-08-09 MED ORDER — INDOMETHACIN ER 75 MG PO CPCR
75.0000 mg | ORAL_CAPSULE | Freq: Two times a day (BID) | ORAL | Status: DC
  Administered 2011-08-09 – 2011-08-11 (×4): 75 mg via ORAL
  Filled 2011-08-09 (×7): qty 1

## 2011-08-09 MED ORDER — SODIUM CHLORIDE 0.9 % IJ SOLN
INTRAMUSCULAR | Status: AC
  Administered 2011-08-09: 10 mL
  Filled 2011-08-09: qty 10

## 2011-08-09 MED ORDER — SODIUM BICARBONATE 8.4 % IV SOLN
INTRAVENOUS | Status: AC
  Filled 2011-08-09: qty 100

## 2011-08-09 MED ORDER — LABETALOL HCL 5 MG/ML IV SOLN
20.0000 mg | Freq: Once | INTRAVENOUS | Status: AC
  Administered 2011-08-09: 20 mg via INTRAVENOUS
  Filled 2011-08-09 (×2): qty 4

## 2011-08-09 MED ORDER — DEXTROSE 5 % IV SOLN
INTRAVENOUS | Status: AC
  Filled 2011-08-09 (×2): qty 1

## 2011-08-09 MED ORDER — PANTOPRAZOLE SODIUM 40 MG PO TBEC
40.0000 mg | DELAYED_RELEASE_TABLET | Freq: Every day | ORAL | Status: DC
  Administered 2011-08-09 – 2011-08-10 (×2): 40 mg via ORAL
  Filled 2011-08-09 (×2): qty 1

## 2011-08-09 NOTE — Progress Notes (Signed)
Subjective: Having pain in Right great toe and right foot, worse with bearing weight, tolerating food without abd pain or vomiting  Objective: Vital signs in last 24 hours: Temp:  [100 F (37.8 C)-102.9 F (39.4 C)] 102.9 F (39.4 C) (10/16 1437) Pulse Rate:  [105-110] 110  (10/16 1437) Resp:  [20-22] 20  (10/16 1437) BP: (153-162)/(93-107) 153/93 mmHg (10/16 1437) SpO2:  [93 %-96 %] 96 % (10/16 1437) Weight:  [80.9 kg (178 lb 5.6 oz)] 178 lb 5.6 oz (80.9 kg) (10/16 0624) Weight change:  Last BM Date: 08/08/11  Intake/Output from previous day: 10/15 0701 - 10/16 0700 In: 3632.5 [P.O.:240; I.V.:3292.5; IV Piggyback:100] Out: 900 [Urine:900]     Physical Exam: General: Alert, awake, oriented x3, in no acute distress. HEENT: No bruits, no goiter. Heart: Regular rate and rhythm, without murmurs, rubs, gallops. Lungs: Clear to auscultation bilaterally. Abdomen: Soft, nontender, nondistended, positive bowel sounds. Extremities: Right great toe painful, erythematous, swollen Neuro: Grossly intact, nonfocal.    Lab Results: Basic Metabolic Panel:  Basename 08/09/11 0453 08/08/11 0533  NA 136 135  K 3.5 3.4*  CL 102 102  CO2 25 20  GLUCOSE 130* 138*  BUN 5* 6  CREATININE 1.08 1.08  CALCIUM 8.1* 8.4  MG 1.9 --  PHOS -- --   Liver Function Tests:  [facility] 08/09/11 0453 08/08/11 0533  AST 51* 76*  ALT 64* 77*  ALKPHOS 171* 196*  BILITOT 0.3 0.3  PROT 6.5 6.8  ALBUMIN 2.6* 2.7*    Basename 08/08/11 0533  LIPASE 117*  AMYLASE --   No results found for this basename: AMMONIA:2 in the last 72 hours CBC:  Basename 08/09/11 0453 08/08/11 0533  WBC 8.8 8.2  NEUTROABS -- --  HGB 10.2* 10.2*  HCT 29.8* 29.9*  MCV 94.6 94.0  PLT 450* 343   Cardiac Enzymes: No results found for this basename: CKTOTAL:3,CKMB:3,CKMBINDEX:3,TROPONINI:3 in the last 72 hours BNP: No results found for this basename: POCBNP:3 in the last 72 hours D-Dimer: No results found for  this basename: DDIMER:2 in the last 72 hours CBG:  Basename 08/09/11 1132 08/09/11 0735 08/08/11 2036 08/08/11 1610 08/08/11 1117 08/08/11 0740  GLUCAP 123* 124* 126* 123* 109* 129*   Hemoglobin A1C: No results found for this basename: HGBA1C in the last 72 hours Fasting Lipid Panel:  Basename 08/07/11 0630  CHOL 163  HDL 27*  LDLCALC 110*  TRIG 129  CHOLHDL 6.0  LDLDIRECT --   Thyroid Function Tests: No results found for this basename: TSH,T4TOTAL,FREET4,T3FREE,THYROIDAB in the last 72 hours Anemia Panel: No results found for this basename: VITAMINB12,FOLATE,FERRITIN,TIBC,IRON,RETICCTPCT in the last 72 hours  No results found for this or any previous visit (from the past 240 hour(s)).  Studies/Results: No results found.  Medications: Scheduled Meds:   . antiseptic oral rinse   Mouth Rinse BID  . atenolol  50 mg Oral QAM  . cefTAZidime (FORTAZ) IV  1 g Intravenous Q8H  . enoxaparin (LOVENOX) injection  1 mg/kg Subcutaneous Q12H  . insulin aspart  0-5 Units Subcutaneous QHS  . insulin aspart  0-9 Units Subcutaneous TID WC  . labetalol  20 mg Intravenous Once  . multivitamins ther. w/minerals  1 tablet Oral Daily  . pantoprazole  40 mg Oral Q1200  . sodium chloride      . vitamin B-1  100 mg Oral Daily  . DISCONTD: enoxaparin  40 mg Subcutaneous Q24H  . DISCONTD: insulin aspart  0-9 Units Subcutaneous Q4H  . DISCONTD: pantoprazole (  PROTONIX) IV  40 mg Intravenous Q24H   Continuous Infusions:   . sodium chloride 0.45 % 1,000 mL with potassium chloride 20 mEq, sodium bicarbonate 1 mEq/mL 100 mEq infusion 150 mL/hr at 08/09/11 0451   PRN Meds:.acetaminophen, bisacodyl, HYDROmorphone, ondansetron (ZOFRAN) IV, ondansetron, polyvinyl alcohol, sodium phosphate  Assessment/Plan:    1. Acute alcoholic pancreatitis, clinically improved, tolerating solid foods without pain, nausea and vomiting, counseled on importance of abstaining from alcohol.   2. HTN (hypertension),  stable   3. Alcohol abuse, no signs of withdrawal, stable  4. Fever, pt still having high fever of 103.  Exam shows right foot edema with tenderness in great toe, possible acute gout, will check uric acid and start indocin.  Also ordered venous dopplers to r/o DVT, and empiric anticoagulation until DVT ruled out.  Unlikely fever is related to acute infection, but continue antibiotics until fever subsides.  Will defer decision for repeat CT abd to GI.  5. Gout attack: Right great toe, check uric acid and start indocin.  6. Dispo.  If tolerating diet and foot/fever improved, then can possibly d/c home tomorrow.    LOS: 7 days   MEMON,JEHANZEB 08/09/2011, 3:48 PM

## 2011-08-09 NOTE — Progress Notes (Addendum)
ANTICOAGULATION CONSULT NOTE - Follow Up Consult  Pharmacy Consult for Lovenox Indication: VTE Treatment  Allergies  Allergen Reactions  . Peanut-Containing Drug Products Shortness Of Breath and Swelling    Patient Measurements: Height: 5\' 11"  (180.3 cm) Weight: 178 lb 5.6 oz (80.9 kg) IBW/kg (Calculated) : 75.3   Vital Signs: Temp: 100 F (37.8 C) (10/16 0500) Temp src: Oral (10/16 0500) BP: 162/107 mmHg (10/16 0500) Pulse Rate: 105  (10/16 0500)  Labs:  Basename 08/09/11 0453 08/08/11 0533 08/07/11 0630  HGB 10.2* 10.2* --  HCT 29.8* 29.9* 33.6*  PLT 450* 343 278  APTT -- -- --  LABPROT -- -- --  INR -- -- --  HEPARINUNFRC -- -- --  CREATININE 1.08 1.08 1.15  CKTOTAL -- -- --  CKMB -- -- --  TROPONINI -- -- --   Estimated Creatinine Clearance: 103.6 ml/min (by C-G formula based on Cr of 1.08).  Medications:  Scheduled:    . antiseptic oral rinse   Mouth Rinse BID  . atenolol  50 mg Oral QAM  . cefTAZidime (FORTAZ) IV  1 g Intravenous Q8H  . enoxaparin (LOVENOX) injection  1 mg/kg Subcutaneous Q12H  . insulin aspart  0-5 Units Subcutaneous QHS  . insulin aspart  0-9 Units Subcutaneous TID WC  . labetalol  20 mg Intravenous Once  . multivitamins ther. w/minerals  1 tablet Oral Daily  . pantoprazole (PROTONIX) IV  40 mg Intravenous Q24H  . potassium chloride  40 mEq Oral Once  . sodium chloride      . vitamin B-1  100 mg Oral Daily  . DISCONTD: enoxaparin  40 mg Subcutaneous Q24H  . DISCONTD: insulin aspart  0-9 Units Subcutaneous Q4H    Assessment: Okay for Protocol  Goal of Therapy:  Treatment Dose   Plan:  Lovenox 1mg /kg sq every 12 hours. CBC every 3 days. Check INR in am.  Mady Gemma 08/09/2011,9:50 AM   The patient is receiving Protonix by the intravenous route.  Based on criteria approved by the Pharmacy and Therapeutics Committee and the Medical Executive Committee, the medication is being converted to the equivalent oral dose  form.  These criteria include: -No Active GI bleeding -Able to tolerate diet of full liquids (or better) or tube feeding -Able to tolerate other medications by the oral or enteral route  If you have any questions about this conversion, please contact the Pharmacy Department (ext [phone]).  Thank you.

## 2011-08-09 NOTE — Progress Notes (Signed)
Subjective: LFTs slightly improved, still elevated.  Pt c/o severe right knee pain, lower extremity swelling-doppler US ordered per hospitalist.  Denies any abdominal pain, nausea, vomiting.  No problems w/ meals.      Objective: Vital signs in last 24 hours: Temp:  [98.9 F (37.2 C)-100 F (37.8 C)] 100 F (37.8 C) (10/16 0500) Pulse Rate:  [94-105] 105  (10/16 0500) Resp:  [18-22] 22  (10/16 0500) BP: (147-162)/(84-107) 162/107 mmHg (10/16 0500) SpO2:  [93 %-97 %] 93 % (10/16 0500) Weight:  [178 lb 5.6 oz (80.9 kg)] 178 lb 5.6 oz (80.9 kg) (10/16 0624) Last BM Date: 08/08/11 General:   Alert,  Well-developed, well-nourished, pleasant and cooperative in NAD.  Appears anxious.  Head:  Normocephalic and atraumatic. Eyes:  Sclera clear, no icterus.   Conjunctiva pink. Mouth:  No deformity or lesions, OP pink/moist. Heart:  Regular rate and rhythm; no murmurs, clicks, rubs,  or gallops. Abdomen:  Soft, nontender and nondistended. No masses, hepatosplenomegaly or hernias noted. Normal bowel sounds, without guarding, and without rebound.   Pulses:  Normal pulses noted. Extremities:  Trace right ankle edema.  + swelling right knee. Skin:  Intact without significant lesions or rashes.  Intake/Output from previous day: 10/15 0701 - 10/16 0700 In: 3632.5 [P.O.:240; I.V.:3292.5; IV Piggyback:100] Out: 900 [Urine:900]  Lab Results:  Basename 08/09/11 0453 08/08/11 0533 08/07/11 0630  WBC 8.8 8.2 9.1  HGB 10.2* 10.2* 11.3*  HCT 29.8* 29.9* 33.6*  PLT 450* 343 278   BMET  Basename 08/09/11 0453 08/08/11 0533 08/07/11 0630  NA 136 135 133*  K 3.5 3.4* 3.9  CL 102 102 101  CO2 25 20 16*  GLUCOSE 130* 138* 81  BUN 5* 6 10  CREATININE 1.08 1.08 1.15  CALCIUM 8.1* 8.4 8.9   LFT  Basename 08/09/11 0453  PROT 6.5  ALBUMIN 2.6*  AST 51*  ALT 64*  ALKPHOS 171*  BILITOT 0.3  BILIDIR 0.1  IBILI 0.2*   Assessment: 1) Acute Pancreatitis suspected secondary to alcohol: Improving  clinically, although I am concerned about fever & tachycardia.  Pt adamantly denies any abdominal pain. 2) Elevated LFTs: Will continue to monitor. Acute hepatitis panel pending. 3) ?ileitis on CT: ? Need for further imaging 4) Right knee/lower extremity edema: Per hospitalist. Plan: 1) Agree w/ doppler US 2) FU acute hepatitis panels 3) Continue supportive measures.  If abdominal pain, nausea or vomiting, would resume NPO status. 4) Will review CT films w/ Dr Jena Gauss & decide whether pt needs further evaluation of ?ileitis seen on CT.  LOS: 7 days   Geoffrey Conner  08/09/2011, 9:58 AM

## 2011-08-10 DIAGNOSIS — F1010 Alcohol abuse, uncomplicated: Secondary | ICD-10-CM

## 2011-08-10 DIAGNOSIS — K859 Acute pancreatitis without necrosis or infection, unspecified: Secondary | ICD-10-CM

## 2011-08-10 LAB — GLUCOSE, CAPILLARY
Glucose-Capillary: 116 mg/dL — ABNORMAL HIGH (ref 70–99)
Glucose-Capillary: 161 mg/dL — ABNORMAL HIGH (ref 70–99)
Glucose-Capillary: 137 mg/dL — ABNORMAL HIGH (ref 70–99)

## 2011-08-10 LAB — CBC
HCT: 29.7 % — ABNORMAL LOW (ref 39.0–52.0)
Hemoglobin: 10 g/dL — ABNORMAL LOW (ref 13.0–17.0)
MCH: 31.6 pg (ref 26.0–34.0)
MCHC: 33.7 g/dL (ref 30.0–36.0)
MCV: 94 fL (ref 78.0–100.0)
Platelets: 520 K/uL — ABNORMAL HIGH (ref 150–400)
RBC: 3.16 MIL/uL — ABNORMAL LOW (ref 4.22–5.81)
RDW: 13 % (ref 11.5–15.5)
WBC: 9.6 K/uL (ref 4.0–10.5)

## 2011-08-10 LAB — COMPREHENSIVE METABOLIC PANEL
BUN: 6 mg/dL (ref 6–23)
CO2: 28 mEq/L (ref 19–32)
Calcium: 8.5 mg/dL (ref 8.4–10.5)
Chloride: 98 mEq/L (ref 96–112)
Creatinine, Ser: 1.11 mg/dL (ref 0.50–1.35)
GFR calc non Af Amer: 86 mL/min — ABNORMAL LOW (ref >90)
Total Bilirubin: 0.4 mg/dL (ref 0.3–1.2)

## 2011-08-10 LAB — PROTIME-INR
INR: 1.14 (ref 0.00–1.49)
Prothrombin Time: 14.8 s (ref 11.6–15.2)

## 2011-08-10 MED ORDER — POTASSIUM CHLORIDE CRYS ER 20 MEQ PO TBCR
40.0000 meq | EXTENDED_RELEASE_TABLET | Freq: Two times a day (BID) | ORAL | Status: AC
  Administered 2011-08-10 – 2011-08-11 (×2): 40 meq via ORAL
  Filled 2011-08-10 (×2): qty 2

## 2011-08-10 NOTE — Progress Notes (Signed)
Subjective: No abdominal pain, nausea or vomiting. Temp 103.2 at 6pm yesterday evening, none since. Duplex US of lower extremity negative for DVT.   Objective: Vital signs in last 24 hours: Temp:  [98.2 F (36.8 C)-103.2 F (39.6 C)] 98.2 F (36.8 C) (10/17 0422) Pulse Rate:  [89-119] 89  (10/17 0422) Resp:  [20] 20  (10/17 0422) BP: (149-157)/(78-98) 149/96 mmHg (10/17 0422) SpO2:  [95 %-98 %] 97 % (10/17 0700) Weight:  [175 lb 14.8 oz (79.8 kg)] 175 lb 14.8 oz (79.8 kg) (10/17 0422) Last BM Date: 08/08/11 General:   Alert and oriented, pleasant Head:  Normocephalic and atraumatic. Eyes:  No icterus, sclera clear. Conjuctiva pink.  Mouth:  Without lesions, mucosa pink and moist.  Heart:  S1, S2 present, no murmurs noted.  Lungs: Clear to auscultation bilaterally, without wheezing, rales, or rhonchi.  Abdomen:  Bowel sounds present, soft, non-tender, non-distended. No HSM or hernias noted. No rebound or guarding. No masses appreciated  Msk:  Symmetrical without gross deformities. Normal posture. Extremities:  Without clubbing or edema. Neurologic:  Alert and  oriented x4;  grossly normal neurologically. Skin:  Warm and dry, intact without significant lesions.  Psych:  Alert and cooperative. Normal mood and affect.  Intake/Output from previous day: 10/16 0701 - 10/17 0700 In: 580 [P.O.:480; IV Piggyback:100] Out: -  Intake/Output this shift:    Lab Results:  Basename 08/10/11 0510 08/09/11 0453 08/08/11 0533  WBC 9.6 8.8 8.2  HGB 10.0* 10.2* 10.2*  HCT 29.7* 29.8* 29.9*  PLT 520* 450* 343   BMET  Basename 08/10/11 0510 08/09/11 0453 08/08/11 0533  NA 136 136 135  K 3.3* 3.5 3.4*  CL 98 102 102  CO2 28 25 20   GLUCOSE 115* 130* 138*  BUN 6 5* 6  CREATININE 1.11 1.08 1.08  CALCIUM 8.5 8.1* 8.4   LFT  Basename 08/10/11 0510 08/09/11 0453 08/08/11 0533  PROT 6.6 6.5 6.8  ALBUMIN 2.6* 2.6* 2.7*  AST 45* 51* 76*  ALT 65* 64* 77*  ALKPHOS 179* 171* 196*    BILITOT 0.4 0.3 0.3  BILIDIR -- 0.1 --  IBILI -- 0.2* --   PT/INR  Basename 08/10/11 0510  LABPROT 14.8  INR 1.14   Hepatitis Panel  Basename 08/08/11 1003  HEPBSAG NEGATIVE  HCVAB NEGATIVE  HEPAIGM NEGATIVE  HEPBIGM NEGATIVE    Studies/Results: US Venous Img Lower Unilateral Right  08/09/2011  *RADIOLOGY REPORT*  Clinical Data: Right leg swelling and pain.  RIGHT LOWER EXTREMITY VENOUS DUPLEX ULTRASOUND  Technique: Gray-scale sonography with graded compression, as well as color Doppler and duplex ultrasound, were performed to evaluate the deep venous system of the lower extremity on the right side from the level of the common femoral vein through the popliteal and proximal calf veins. Spectral Doppler was utilized to evaluate flow at rest and with distal augmentation maneuvers.  Comparison: None  Findings:  Normal compressibility of the right common femoral, superficial femoral, and popliteal veins is demonstrated, as well as the visualized proximal calf veins.  No filling defects to suggest DVT on grayscale or color Doppler imaging.  Doppler waveforms show normal direction of venous flow, normal respiratory phasicity and response to augmentation.  IMPRESSION: 1.  No evidence of lower extremity deep vein thrombosis.  Original Report Authenticated By: Dellia Cloud, M.D.    Assessment: 35 year old male with acute alcoholic pancreatitis, clinically improved. CT 10/11 with severe pancreatitis noted. Febrile yesterday evening with temp 103.2. No associated abdominal pain, nausea  or vomiting. LFTs essentially stable. Will continue to follow him while inpatient. Acute hepatitis panel negative.  Plan: Supportive measures We will continue to follow him inpatient with you    LOS: 8 days   Gerrit Halls  08/10/2011, 8:21 AM

## 2011-08-10 NOTE — Progress Notes (Signed)
Increased fever < 24 hr now afebrile on Indocin BID for gouty flare. Doubt infected pancreatic phlegmon because pt has no NV or abd pain. Mildly elevated liver enzymes, Hepatitis panel neg. Consider ASMA, ANA, quantitative Igs if liver enzymes remain elevated. Supportive care.

## 2011-08-10 NOTE — Progress Notes (Signed)
Subjective: Denies any abdominal pain. Denies nausea vomiting diarrhea. His lower extremity pain is improving   Objective: Vital signs in last 24 hours: Temp:  [98.2 F (36.8 C)-103.2 F (39.6 C)] 98.3 F (36.8 C) (10/17 0925) Pulse Rate:  [82-119] 82  (10/17 0925) Resp:  [18-20] 18  (10/17 0925) BP: (147-157)/(78-98) 147/97 mmHg (10/17 0925) SpO2:  [95 %-99 %] 99 % (10/17 0925) Weight:  [79.8 kg (175 lb 14.8 oz)] 175 lb 14.8 oz (79.8 kg) (10/17 0422) Weight change: -1.1 kg (-2 lb 6.8 oz) Last BM Date: 08/09/11  Intake/Output from previous day: 10/16 0701 - 10/17 0700 In: 580 [P.O.:480; IV Piggyback:100] Out: -  Total I/O In: 240 [P.O.:240] Out: -    Physical Exam: General: Alert, awake, oriented x3, in no acute distress. HEENT: No bruits, no goiter. Heart: Regular rate and rhythm, without murmurs, rubs, gallops. Lungs: Clear to auscultation bilaterally. Abdomen: Soft, nontender, nondistended, positive bowel sounds. Extremities: Right great toe painful, erythematous, swollen Neuro: Grossly intact, nonfocal.    Lab Results: Basic Metabolic Panel:  Basename 08/10/11 0510 08/09/11 0453  NA 136 136  K 3.3* 3.5  CL 98 102  CO2 28 25  GLUCOSE 115* 130*  BUN 6 5*  CREATININE 1.11 1.08  CALCIUM 8.5 8.1*  MG -- 1.9  PHOS -- --   Liver Function Tests:  [facility] 08/10/11 0510 08/09/11 0453  AST 45* 51*  ALT 65* 64*  ALKPHOS 179* 171*  BILITOT 0.4 0.3  PROT 6.6 6.5  ALBUMIN 2.6* 2.6*    Basename 08/08/11 0533  LIPASE 117*  AMYLASE --   No results found for this basename: AMMONIA:2 in the last 72 hours CBC:  Basename 08/10/11 0510 08/09/11 0453  WBC 9.6 8.8  NEUTROABS -- --  HGB 10.0* 10.2*  HCT 29.7* 29.8*  MCV 94.0 94.6  PLT 520* 450*   Cardiac Enzymes: No results found for this basename: CKTOTAL:3,CKMB:3,CKMBINDEX:3,TROPONINI:3 in the last 72 hours BNP: No results found for this basename: POCBNP:3 in the last 72 hours D-Dimer: No results  found for this basename: DDIMER:2 in the last 72 hours CBG:  Basename 08/10/11 1136 08/10/11 0715 08/09/11 2129 08/09/11 1648 08/09/11 1132 08/09/11 0735  GLUCAP 161* 116* 134* 126* 123* 124*   Hemoglobin A1C: No results found for this basename: HGBA1C in the last 72 hours Fasting Lipid Panel: No results found for this basename: CHOL,HDL,LDLCALC,TRIG,CHOLHDL,LDLDIRECT in the last 72 hours Thyroid Function Tests: No results found for this basename: TSH,T4TOTAL,FREET4,T3FREE,THYROIDAB in the last 72 hours Anemia Panel: No results found for this basename: VITAMINB12,FOLATE,FERRITIN,TIBC,IRON,RETICCTPCT in the last 72 hours  No results found for this or any previous visit (from the past 240 hour(s)).  Studies/Results: US Venous Img Lower Unilateral Right  08/09/2011  *RADIOLOGY REPORT*  Clinical Data: Right leg swelling and pain.  RIGHT LOWER EXTREMITY VENOUS DUPLEX ULTRASOUND  Technique: Gray-scale sonography with graded compression, as well as color Doppler and duplex ultrasound, were performed to evaluate the deep venous system of the lower extremity on the right side from the level of the common femoral vein through the popliteal and proximal calf veins. Spectral Doppler was utilized to evaluate flow at rest and with distal augmentation maneuvers.  Comparison: None  Findings:  Normal compressibility of the right common femoral, superficial femoral, and popliteal veins is demonstrated, as well as the visualized proximal calf veins.  No filling defects to suggest DVT on grayscale or color Doppler imaging.  Doppler waveforms show normal direction of venous flow, normal respiratory  phasicity and response to augmentation.  IMPRESSION: 1.  No evidence of lower extremity deep vein thrombosis.  Original Report Authenticated By: Dellia Cloud, M.D.    Medications: Scheduled Meds:    . antiseptic oral rinse   Mouth Rinse BID  . atenolol  50 mg Oral QAM  . cefTAZidime (FORTAZ) IV  1 g  Intravenous Q8H  . indomethacin  75 mg Oral BID WC  . insulin aspart  0-5 Units Subcutaneous QHS  . insulin aspart  0-9 Units Subcutaneous TID WC  . multivitamins ther. w/minerals  1 tablet Oral Daily  . pantoprazole  40 mg Oral Q1200  . vitamin B-1  100 mg Oral Daily  . DISCONTD: enoxaparin (LOVENOX) injection  1 mg/kg Subcutaneous Q12H   Continuous Infusions:    . DISCONTD: sodium chloride 0.45 % 1,000 mL with potassium chloride 20 mEq, sodium bicarbonate 1 mEq/mL 100 mEq infusion 150 mL/hr at 08/09/11 0451   PRN Meds:.acetaminophen, bisacodyl, HYDROmorphone, ondansetron (ZOFRAN) IV, ondansetron, polyvinyl alcohol, sodium phosphate  Assessment/Plan:    1. Acute alcoholic pancreatitis, clinically improved, tolerating solid foods without pain, nausea and vomiting, counseled on importance of abstaining from alcohol.   2. HTN (hypertension), stable   3. Alcohol abuse, no signs of withdrawal, stable  4. Fever, pt still having high fever of 103. Thought to be secondary to gouty arthritis. Patient on Indocin. Patient did have right lower extremity Doppler ultrasound which ruled out DVT. Denies any burning while passing urine denies any cough. Empirically on Fortaz Intra-abdominal infection is less likely in this patient because of lack of tenderness. There is also no abdominal pain nausea, vomiting, or diarrhea. Patient will be observed overnight in the morning if he is afebrile he will be discharged home.  5. Gout attack: Right great toe, check uric acid and start indocin.  6. Dispo.  If tolerating diet and foot/fever improved, then can possibly d/c home tomorrow.    LOS: 8 days   Geoffrey Conner A 08/10/2011, 4:04 PM

## 2011-08-11 ENCOUNTER — Telehealth: Payer: Self-pay | Admitting: Gastroenterology

## 2011-08-11 DIAGNOSIS — K859 Acute pancreatitis without necrosis or infection, unspecified: Secondary | ICD-10-CM

## 2011-08-11 LAB — COMPREHENSIVE METABOLIC PANEL
ALT: 59 U/L — ABNORMAL HIGH (ref 0–53)
AST: 37 U/L (ref 0–37)
Albumin: 2.7 g/dL — ABNORMAL LOW (ref 3.5–5.2)
Calcium: 8.8 mg/dL (ref 8.4–10.5)
GFR calc Af Amer: >90 mL/min (ref >90)
Sodium: 136 mEq/L (ref 135–145)
Total Protein: 6.9 g/dL (ref 6.0–8.3)

## 2011-08-11 LAB — GLUCOSE, CAPILLARY

## 2011-08-11 LAB — STREP A DNA PROBE

## 2011-08-11 MED ORDER — FAMOTIDINE 20 MG PO TABS: 20.0000 mg | ORAL_TABLET | Freq: Two times a day (BID) | ORAL | Status: DC

## 2011-08-11 NOTE — Telephone Encounter (Signed)
Please schedule patient a f/u OV for 4 weeks.

## 2011-08-11 NOTE — Discharge Summary (Signed)
DISCHARGE SUMMARY  NATHANIEL WAKELEY  MR#: [id number]  DOB:[birth year]  Date of Admission: 08/02/2011 Date of Discharge: 08/11/2011  Attending Physician: Clydia Llano A  Patient's PCP: Veneda Melter, MD  Consults:  Corbin Ade, MD, GI  Brief History and Examination Geoffrey Conner is an 35 y.o. male, African American with a history of hypertension and a strong history of alcohol abuse, otherwise in good health until Sunday he developed severe epigastric pain, no radiation. No nausea or vomiting or diarrhea. No fever. agent typically drinks about of fifth of vodka per day,with friends, but has not had a drink since Saturday. He is tried no home remedies for the pain, was unable to go to work today.  Discharge Diagnoses: .Acute alcoholic pancreatitis .HTN (hypertension) .Hyperglycemia .Hypokalemia with normal acid-base balance .Alcohol abuse:    Current Discharge Medication List    START taking these medications   Details  famotidine (PEPCID) 20 MG tablet Take 1 tablet (20 mg total) by mouth 2 (two) times daily. Qty: 30 tablet, Refills: 0      CONTINUE these medications which have NOT CHANGED   Details  amLODipine (NORVASC) 10 MG tablet Take 10 mg by mouth daily.      atenolol (TENORMIN) 50 MG tablet Take 50 mg by mouth every morning.            Hospital Course:  .Acute alcoholic pancreatitis Patient was admitted to the hospital started on IV fluids for aggressive hydration. Patient was n.p.o. for some time. Patient evaluated by CT scan of abdomen and pelvis and showed severe acute pancreatitis. His pancreatitis likely related to his alcohol drinking. As patient drinks fifth of vodka a day. There is no recent drinking. GI was consulted. They recommended to continue the n.p.o. advance diet as tolerated. Patient was getting IV pain medications as well as anti-emetics. Patient tolerated diet so it was advanced. Balanced salt he was okayed to be discharged home today.    The patient did have a degenerative fever with highest temperature of 103. At the time of admission he was empirically covered with Elita Quick. After the fever curve went down the empiric antibiotic was discontinued. There there was no concern about intra-abdominal infection like infected phlegmon or infected pseudopancreatic cyst. Because of the patient does not have nausea, vomiting or abdominal pain and he was tolerating diet okay. That was okay was okayed with GI at the time of discharge.   Marland KitchenHTN (hypertension) His blood pressure was controlled during this hospital stay his home medication was started. He was normotensive throughout the hospital stay.   .Transaminitis: On admission he  has a high AST and ALT. This is likely to be secondary to chronic alcohol use. Patient was evaluated by ultrasound and CT scan did not show any evidence of cholelithiasis. Patient also has acute hepatitis panel done which was all negative.  .Hypokalemia.  This is Theron Arista placed during this hospital stay   .Alcohol abuse Patient counseled extensively on his drinking. He did not have any symptoms or signs of alcohol withdrawal during this hospitalization.   Day of Discharge BP 156/91  Pulse 76  Temp(Src) 98 F (36.7 C) (Oral)  Resp 18  Ht 5\' 11"  (1.803 m)  Wt 79.3 kg (174 lb 13.2 oz)  BMI 24.38 kg/m2  SpO2 98%  Physical Exam: General: Alert, awake, oriented x3, in no acute distress.  HEENT: No bruits, no goiter.  Heart: Regular rate and rhythm, without murmurs, rubs, gallops.  Lungs: Clear to auscultation bilaterally.  Abdomen: Soft, nontender, nondistended, positive bowel sounds.  Extremities: Right great toe painful, erythematous, swollen  Neuro: Grossly intact, nonfocal.   Results for orders placed during the hospital encounter of 08/02/11 (from the past 24 hour(s))  GLUCOSE, CAPILLARY     Status: Abnormal   Collection Time   08/10/11 11:36 AM      Component Value Range   Glucose-Capillary 161  (*) 70 - 99 (mg/dL)   Comment 1 Documented in Chart     Comment 2 Notify RN    GLUCOSE, CAPILLARY     Status: Abnormal   Collection Time   08/10/11  4:21 PM      Component Value Range   Glucose-Capillary 137 (*) 70 - 99 (mg/dL)   Comment 1 Documented in Chart     Comment 2 Notify RN    GLUCOSE, CAPILLARY     Status: Abnormal   Collection Time   08/10/11  8:35 PM      Component Value Range   Glucose-Capillary 147 (*) 70 - 99 (mg/dL)  COMPREHENSIVE METABOLIC PANEL     Status: Abnormal   Collection Time   08/11/11  5:07 AM      Component Value Range   Sodium 136  135 - 145 (mEq/L)   Potassium 3.7  3.5 - 5.1 (mEq/L)   Chloride 97  96 - 112 (mEq/L)   CO2 27  19 - 32 (mEq/L)   Glucose, Bld 127 (*) 70 - 99 (mg/dL)   BUN 8  6 - 23 (mg/dL)   Creatinine, Ser 1.61  0.50 - 1.35 (mg/dL)   Calcium 8.8  8.4 - 09.6 (mg/dL)   Total Protein 6.9  6.0 - 8.3 (g/dL)   Albumin 2.7 (*) 3.5 - 5.2 (g/dL)   AST 37  0 - 37 (U/L)   ALT 59 (*) 0 - 53 (U/L)   Alkaline Phosphatase 167 (*) 39 - 117 (U/L)   Total Bilirubin 0.2 (*) 0.3 - 1.2 (mg/dL)   GFR calc non Af Amer 84 (*) >90 (mL/min)   GFR calc Af Amer >90  >90 (mL/min)  GLUCOSE, CAPILLARY     Status: Abnormal   Collection Time   08/11/11  7:23 AM      Component Value Range   Glucose-Capillary 124 (*) 70 - 99 (mg/dL)   Comment 1 Documented in Chart     Comment 2 Notify RN      Disposition: Home   Follow-up Appts: Discharge Orders    Future Orders Please Complete By Expires   Diet - low sodium heart healthy      Comments:   Low fat/ low residue diet, advance as tolerated   Call MD for:  persistant nausea and vomiting      Call MD for:  severe uncontrolled pain              Signed: Dorothea Yow A 08/11/2011, 10:34 AM

## 2011-08-11 NOTE — Progress Notes (Signed)
Pt discharged home today per MD. Pt's VS stable at this time. Pt's IV site D/C'd and WNL. Pt provided with home medication list, discharge instructions, prescriptions and excuse from work. Pt verbalized understanding. Pt left floor in stable condition accompanied by RN. Dagoberto Ligas, RN

## 2011-08-11 NOTE — Progress Notes (Cosign Needed)
Subjective: Feels better. Abd pain much better. No n/v. Afebrile last 24 hours. Tachycardia resolved. LE edema resolved. Objective: Vital signs in last 24 hours: Temp:  [98 F (36.7 C)-98.9 F (37.2 C)] 98 F (36.7 C) (10/18 0403) Pulse Rate:  [73-84] 76  (10/18 0807) Resp:  [18] 18  (10/18 0403) BP: (147-176)/(91-104) 156/91 mmHg (10/18 0403) SpO2:  [98 %-100 %] 98 % (10/18 0807) Weight:  [174 lb 13.2 oz (79.3 kg)] 174 lb 13.2 oz (79.3 kg) (10/18 0403) Last BM Date: 08/10/11 General:   Alert,  Well-developed, well-nourished, pleasant and cooperative in NAD Head:  Normocephalic and atraumatic. Eyes:  Sclera clear, no icterus.  Chest: CTA bilaterally without rales, rhonchi, crackles.    Heart:  Regular rate and rhythm; no murmurs, clicks, rubs,  or gallops. Abdomen:  Soft, mild epigastric tenderness, nondistended. No masses, hepatosplenomegaly or hernias noted. Normal bowel sounds, without guarding, and without rebound.   Extremities:  Without clubbing, deformity or edema. Neurologic:  Alert and  oriented x4;  grossly normal neurologically. Skin:  Intact without significant lesions or rashes. Psych:  Alert and cooperative. Normal mood and affect.  Intake/Output from previous day: 10/17 0701 - 10/18 0700 In: 650 [P.O.:600; IV Piggyback:50] Out: -  Intake/Output this shift:    Lab Results: CBC  Basename 08/10/11 0510 08/09/11 0453  WBC 9.6 8.8  HGB 10.0* 10.2*  HCT 29.7* 29.8*  MCV 94.0 94.6  PLT 520* 450*   BMET  Basename 08/11/11 0507 08/10/11 0510 08/09/11 0453  NA 136 136 136  K 3.7 3.3* 3.5  CL 97 98 102  CO2 27 28 25   GLUCOSE 127* 115* 130*  BUN 8 6 5*  CREATININE 1.13 1.11 1.08  CALCIUM 8.8 8.5 8.1*   LFTs  Basename 08/11/11 0507 08/10/11 0510 08/09/11 0453  BILITOT 0.2* 0.4 0.3  BILIDIR -- -- 0.1  IBILI -- -- 0.2*  ALKPHOS 167* 179* 171*  AST 37 45* 51*  ALT 59* 65* 64*  PROT 6.9 6.6 6.5  ALBUMIN 2.7* 2.6* 2.6*   No results found for this  basename: LIPASE:3 in the last 72 hours PT/INR  Basename 08/10/11 0510  LABPROT 14.8  INR 1.14   Hepatitis Panel  Basename 08/08/11 1003  HEPBSAG NEGATIVE  HCVAB NEGATIVE  HEPAIGM NEGATIVE  HEPBIGM NEGATIVE      Assessment:  #1 ETOH pancreatitis. Improved, clinically. Tolerating diet.  #2 LFTs improving but remain elevated. Recommend outpatient f/u with [place]. We will document resolution or further w/u as appropriate. #3 Anemia. Likely hemodilutional and secondary to acute illness. Will f/u as outpatient. #4 Ileitis on CT. ?secondary to inflammation from acute pancreatitis or other. Patient denies chronic bowel symptoms. Will f/u as outpatient.  Plan:  #1 Stable for discharge from GI standpoint. #2 Discussed with patient the importance for etoh cessation and is voices understanding and agrees. #3 Patient aware that our office will schedule f/u with him to address remaining issues as outlined above.   LOS: 9 days   Tana Coast  08/11/2011, 8:11 AM

## 2011-08-15 NOTE — Telephone Encounter (Signed)
Pt is aware of OV for 11/21 at 10 with [facility]SL

## 2011-09-14 ENCOUNTER — Inpatient Hospital Stay: Payer: 59 | Admitting: Gastroenterology

## 2011-09-14 ENCOUNTER — Telehealth: Payer: Self-pay | Admitting: Gastroenterology

## 2011-09-14 NOTE — Telephone Encounter (Signed)
Pt was a no show

## 2011-09-25 ENCOUNTER — Emergency Department (HOSPITAL_COMMUNITY)
Admission: EM | Admit: 2011-09-25 | Discharge: 2011-09-25 | Disposition: A | Discharge status: Home / Self Care | Payer: 59 | Attending: Emergency Medicine | Admitting: Emergency Medicine

## 2011-09-25 ENCOUNTER — Emergency Department (HOSPITAL_COMMUNITY): Payer: 59

## 2011-09-25 ENCOUNTER — Encounter (HOSPITAL_COMMUNITY): Payer: Self-pay | Admitting: *Deleted

## 2011-09-25 DIAGNOSIS — S93409A Sprain of unspecified ligament of unspecified ankle, initial encounter: Secondary | ICD-10-CM

## 2011-09-25 DIAGNOSIS — F1010 Alcohol abuse, uncomplicated: Secondary | ICD-10-CM | POA: Insufficient documentation

## 2011-09-25 DIAGNOSIS — Z87891 Personal history of nicotine dependence: Secondary | ICD-10-CM | POA: Insufficient documentation

## 2011-09-25 DIAGNOSIS — X58XXXA Exposure to other specified factors, initial encounter: Secondary | ICD-10-CM | POA: Insufficient documentation

## 2011-09-25 DIAGNOSIS — I10 Essential (primary) hypertension: Secondary | ICD-10-CM | POA: Insufficient documentation

## 2011-09-25 DIAGNOSIS — M7660 Achilles tendinitis, unspecified leg: Secondary | ICD-10-CM | POA: Insufficient documentation

## 2011-09-25 MED ORDER — IBUPROFEN 800 MG PO TABS
800.0000 mg | ORAL_TABLET | Freq: Once | ORAL | Status: AC
  Administered 2011-09-25: 800 mg via ORAL
  Filled 2011-09-25: qty 1

## 2011-09-25 MED ORDER — HYDROCODONE-ACETAMINOPHEN 5-325 MG PO TABS
2.0000 | ORAL_TABLET | Freq: Once | ORAL | Status: AC
  Administered 2011-09-25: 2 via ORAL
  Filled 2011-09-25: qty 2

## 2011-09-25 MED ORDER — HYDROCODONE-ACETAMINOPHEN 5-325 MG PO TABS: ORAL_TABLET | ORAL | Status: DC

## 2011-09-25 MED ORDER — DICLOFENAC SODIUM 75 MG PO TBEC: DELAYED_RELEASE_TABLET | ORAL | Status: DC

## 2011-09-25 NOTE — ED Notes (Signed)
Pt a/ox4. resp even and unlabored. NAD at this time. D/C instructions reviewed and Rx x2 reviewed with pt. Pt verbalized understanding. Pt ambulated to lobby with steady gate.

## 2011-09-25 NOTE — ED Provider Notes (Signed)
History     CSN: [id number] Arrival date & time: 09/25/2011 12:57 PM   First MD Initiated Contact with Patient 09/25/11 1506      Chief Complaint  Patient presents with  . Ankle Pain    (Consider location/radiation/quality/duration/timing/severity/associated sxs/prior treatment) Patient is a 35 y.o. male presenting with ankle pain. The history is provided by the patient.  Ankle Pain  The incident occurred more than 2 days ago. Incident location: unknown. There was no injury mechanism. The pain is present in the left ankle. The quality of the pain is described as aching. The pain is moderate. The pain has been constant since onset. Pertinent negatives include no numbness, no loss of sensation and no tingling. He reports no foreign bodies present. The symptoms are aggravated by bearing weight. He has tried NSAIDs for the symptoms. The treatment provided no relief.    Past Medical History  Diagnosis Date  . Hypertension     Past Surgical History  Procedure Date  . No past surgeries     Family History  Problem Relation Age of Onset  . Hypertension Mother   . Hypertension Father   . Cancer Mother     breast 9 yrs ago    History  Substance Use Topics  . Smoking status: Former Games developer  . Smokeless tobacco: Not on file  . Alcohol Use: No     3-4 fifths of vodka per week with friends; 2 beers/day      Review of Systems  Neurological: Negative for tingling and numbness.    Allergies  Peanut-containing drug products  Home Medications   Current Outpatient Rx  Name Route Sig Dispense Refill  . AMLODIPINE BESYLATE 10 MG PO TABS Oral Take 10 mg by mouth daily.      . ATENOLOL 50 MG PO TABS Oral Take 50 mg by mouth every morning.      Marland Kitchen LISINOPRIL 10 MG PO TABS Oral Take 1 tablet by mouth Daily.      BP 140/97  Pulse 78  Temp(Src) 98.8 F (37.1 C) (Oral)  Resp 18  Ht 5\' 11"  (1.803 m)  Wt 178 lb (80.74 kg)  BMI 24.83 kg/m2  SpO2 98%  Physical Exam  Nursing note  and vitals reviewed. Constitutional: He is oriented to person, place, and time. He appears well-developed and well-nourished.  Non-toxic appearance.  HENT:  Head: Normocephalic.  Right Ear: Tympanic membrane and external ear normal.  Left Ear: Tympanic membrane and external ear normal.  Eyes: EOM and lids are normal. Pupils are equal, round, and reactive to light.  Neck: Normal range of motion. Neck supple. Carotid bruit is not present.  Cardiovascular: Normal rate, regular rhythm, normal heart sounds, intact distal pulses and normal pulses.   Pulmonary/Chest: Breath sounds normal. No respiratory distress.  Abdominal: Soft. Bowel sounds are normal. There is no tenderness. There is no guarding.  Musculoskeletal: Normal range of motion.       Lateral malleolus tenderness and swelling. Achilles intact on the left, but sore with attempted ROM. Distal pulses and sensory wnl.  Lymphadenopathy:       Head (right side): No submandibular adenopathy present.       Head (left side): No submandibular adenopathy present.    He has no cervical adenopathy.  Neurological: He is alert and oriented to person, place, and time. He has normal strength. No cranial nerve deficit or sensory deficit.  Skin: Skin is warm and dry.  Psychiatric: He has a normal mood and  affect. His speech is normal.    ED Course  Procedures (including critical care time)  Labs Reviewed - No data to display Dg Ankle Complete Left  09/25/2011  *RADIOLOGY REPORT*  Clinical Data: Ankle swelling, pain.  LEFT ANKLE COMPLETE - 3+ VIEW  Comparison: None  Findings: There is a joint effusion.  Lateral soft tissue swelling. No acute bony abnormality.  No visible fracture, subluxation or dislocation.  IMPRESSION: Joint effusion with lateral soft tissue swelling.  No acute bony abnormality.  Original Report Authenticated By: Cyndie Chime, M.D.     Dx: Ankle sprain   MDM  I have reviewed nursing notes, vital signs, and all appropriate  lab and imaging results for this patient.        Kathie Dike, [place] 09/25/11 1705

## 2011-09-25 NOTE — ED Notes (Signed)
Pt c/o left foot pain; pt denies any injury

## 2011-09-25 NOTE — ED Notes (Signed)
ICE applied to left ankle.

## 2011-09-25 NOTE — ED Notes (Signed)
Pt presents with left ankle swelling and pain. Pt denies injury. Swelling noted to inner and outer aspect of left ankle.

## 2011-09-26 NOTE — ED Provider Notes (Signed)
Medical screening examination/treatment/procedure(s) were performed by non-physician practitioner and as supervising physician I was immediately available for consultation/collaboration.   Joya Gaskins, MD 09/26/11 1310

## 2012-06-04 ENCOUNTER — Encounter (HOSPITAL_COMMUNITY): Payer: Self-pay | Admitting: *Deleted

## 2012-06-04 ENCOUNTER — Emergency Department (HOSPITAL_COMMUNITY): Payer: 59

## 2012-06-04 ENCOUNTER — Emergency Department (HOSPITAL_COMMUNITY)
Admission: EM | Admit: 2012-06-04 | Discharge: 2012-06-04 | Disposition: A | Discharge status: Home / Self Care | Payer: 59 | Attending: Emergency Medicine | Admitting: Emergency Medicine

## 2012-06-04 DIAGNOSIS — K859 Acute pancreatitis without necrosis or infection, unspecified: Secondary | ICD-10-CM | POA: Insufficient documentation

## 2012-06-04 DIAGNOSIS — I10 Essential (primary) hypertension: Secondary | ICD-10-CM | POA: Insufficient documentation

## 2012-06-04 DIAGNOSIS — R109 Unspecified abdominal pain: Secondary | ICD-10-CM | POA: Insufficient documentation

## 2012-06-04 HISTORY — DX: Acute pancreatitis without necrosis or infection, unspecified: K85.90

## 2012-06-04 LAB — CBC WITH DIFFERENTIAL/PLATELET
Basophils Absolute: 0 10*3/uL (ref 0.0–0.1)
Eosinophils Relative: 4 % (ref 0–5)
Lymphocytes Relative: 17 % (ref 12–46)
Neutro Abs: 5.9 10*3/uL (ref 1.7–7.7)
Neutrophils Relative %: 69 % (ref 43–77)
Platelets: 258 10*3/uL (ref 150–400)
RDW: 13.1 % (ref 11.5–15.5)
WBC: 8.5 10*3/uL (ref 4.0–10.5)

## 2012-06-04 LAB — COMPREHENSIVE METABOLIC PANEL
ALT: 36 U/L (ref 0–53)
AST: 26 U/L (ref 0–37)
CO2: 24 mEq/L (ref 19–32)
Calcium: 10 mg/dL (ref 8.4–10.5)
GFR calc non Af Amer: 65 mL/min — ABNORMAL LOW (ref >90)
Sodium: 135 mEq/L (ref 135–145)

## 2012-06-04 LAB — URINALYSIS, ROUTINE W REFLEX MICROSCOPIC
Glucose, UA: NEGATIVE mg/dL
Hgb urine dipstick: NEGATIVE
Protein, ur: NEGATIVE mg/dL
Specific Gravity, Urine: 1.015 (ref 1.005–1.030)

## 2012-06-04 MED ORDER — PROMETHAZINE HCL 25 MG PO TABS: 25.0000 mg | ORAL_TABLET | Freq: Four times a day (QID) | ORAL | Status: AC | PRN

## 2012-06-04 MED ORDER — SODIUM CHLORIDE 0.9 % IV BOLUS (SEPSIS)
250.0000 mL | Freq: Once | INTRAVENOUS | Status: AC
  Administered 2012-06-04: 19:00:00 via INTRAVENOUS

## 2012-06-04 MED ORDER — HYDROMORPHONE HCL PF 1 MG/ML IJ SOLN
1.0000 mg | Freq: Once | INTRAMUSCULAR | Status: AC
  Administered 2012-06-04: 1 mg via INTRAVENOUS
  Filled 2012-06-04: qty 1

## 2012-06-04 MED ORDER — ONDANSETRON HCL 4 MG/2ML IJ SOLN
4.0000 mg | Freq: Once | INTRAMUSCULAR | Status: AC
  Administered 2012-06-04: 4 mg via INTRAVENOUS
  Filled 2012-06-04: qty 2

## 2012-06-04 MED ORDER — IOHEXOL 300 MG/ML  SOLN
100.0000 mL | Freq: Once | INTRAMUSCULAR | Status: AC | PRN
  Administered 2012-06-04: 100 mL via INTRAVENOUS

## 2012-06-04 MED ORDER — OXYCODONE-ACETAMINOPHEN 5-325 MG PO TABS: 1.0000 | ORAL_TABLET | Freq: Four times a day (QID) | ORAL | Status: DC | PRN

## 2012-06-04 MED ORDER — SODIUM CHLORIDE 0.9 % IV SOLN: INTRAVENOUS | Status: DC

## 2012-06-04 MED ORDER — OXYCODONE-ACETAMINOPHEN 5-325 MG PO TABS: 2.0000 | ORAL_TABLET | ORAL | Status: DC | PRN

## 2012-06-04 NOTE — ED Notes (Signed)
Feels like my stomach gets full when I eat just a little."  Seen by His md today.  "feels like when I had pancreas trouble" No NV

## 2012-06-04 NOTE — ED Notes (Signed)
Asked patient about his high blood pressure. Patient stated "I was taking 3 blood pressure pills a day and the doctor cut me back months ago to one pill. I haven't been back to check my blood pressure."

## 2012-06-04 NOTE — ED Provider Notes (Signed)
History  This chart was scribed for Geoffrey Jakes, MD by Erskine Emery. This patient was seen in room APA03/APA03 and the patient's care was started at 18:14.   CSN: [id number]  Arrival date & time 06/04/12  1708   First MD Initiated Contact with Patient 06/04/12 1814      Chief Complaint  Patient presents with  . Abdominal Pain    (Consider location/radiation/quality/duration/timing/severity/associated sxs/prior treatment) HPI Geoffrey Conner is a 36 y.o. male with a h/o pancreatitis who presents to the Emergency Department complaining of intermittent sharp LUQ abdominal pain of a 7/10 severity currently that sometimes radiates to the back since yesterday. Pt denies any nausea, vomiting, diarrhrea, SOB, chest pain, fever, dysuria, rash, neck pain, or headache and reports the pain is similar to his previous episode of pancreatitis. Pt was seen at the [facility] in [place] earlier today.   Past Medical History  Diagnosis Date  . Hypertension   . Pancreatitis     Past Surgical History  Procedure Date  . No past surgeries     Family History  Problem Relation Age of Onset  . Hypertension Mother   . Hypertension Father   . Cancer Mother     breast 9 yrs ago    History  Substance Use Topics  . Smoking status: Former Games developer  . Smokeless tobacco: Not on file  . Alcohol Use: No     3-4 fifths of vodka per week with friends; 2 beers/day      Review of Systems  Constitutional: Negative for fever and chills.  HENT: Negative for neck pain.   Eyes: Negative for visual disturbance.  Respiratory: Negative for shortness of breath.   Cardiovascular: Negative for chest pain.  Gastrointestinal: Positive for abdominal pain. Negative for nausea, vomiting and diarrhea.  Genitourinary: Negative for dysuria.  Musculoskeletal: Negative for joint swelling.  Skin: Negative for rash.  Neurological: Negative for weakness and headaches.  Psychiatric/Behavioral: Negative  for hallucinations.  All other systems reviewed and are negative.    Allergies  Peanut-containing drug products  Home Medications   Current Outpatient Rx  Name Route Sig Dispense Refill  . ATENOLOL 25 MG PO TABS Oral Take 25 mg by mouth every morning.    . OXYCODONE-ACETAMINOPHEN 5-325 MG PO TABS Oral Take 2 tablets by mouth every 4 (four) hours as needed for pain. 6 tablet 0  . OXYCODONE-ACETAMINOPHEN 5-325 MG PO TABS Oral Take 1-2 tablets by mouth every 6 (six) hours as needed for pain. 15 tablet 0  . PROMETHAZINE HCL 25 MG PO TABS Oral Take 1 tablet (25 mg total) by mouth every 6 (six) hours as needed for nausea. 12 tablet 0    Triage Vitals: BP 188/119  Pulse 76  Temp 98.9 F (37.2 C) (Oral)  Resp 20  Ht 5\' 11"  (1.803 m)  Wt 179 lb (81.194 kg)  BMI 24.97 kg/m2  SpO2 100%  Physical Exam  Nursing note and vitals reviewed. Constitutional: He is oriented to person, place, and time. He appears well-developed and well-nourished. No distress.  HENT:  Head: Normocephalic and atraumatic.  Eyes: EOM are normal. No scleral icterus.       Sclera is clear, no scleral icterus.  Neck: Neck supple. No tracheal deviation present.  Cardiovascular: Normal rate, regular rhythm and normal heart sounds.   No murmur heard. Pulmonary/Chest: Effort normal and breath sounds normal. No respiratory distress. He has no wheezes.  Abdominal: Soft. Bowel sounds are normal. He exhibits no  distension.       No abdominal tenderness, even in LUQ.   Musculoskeletal: Normal range of motion. He exhibits no edema.  Neurological: He is alert and oriented to person, place, and time. No cranial nerve deficit. Coordination normal.  Skin: Skin is warm and dry.  Psychiatric: He has a normal mood and affect.    ED Course  Procedures (including critical care time) DIAGNOSTIC STUDIES: Oxygen Saturation is 100% on room air, normal by my interpretation.    COORDINATION OF CARE: 18:35--I evaluated the patient  and we discussed a treatment plan including CAT scan and labs to which the pt agreed.    Labs Reviewed  COMPREHENSIVE METABOLIC PANEL - Abnormal; Notable for the following:    Glucose, Bld 115 (*)     Creatinine, Ser 1.38 (*)     Total Protein 8.4 (*)     GFR calc non Af Amer 65 (*)     GFR calc Af Amer 76 (*)     All other components within normal limits  LIPASE, BLOOD - Abnormal; Notable for the following:    Lipase 1344 (*)     All other components within normal limits  CBC WITH DIFFERENTIAL  URINALYSIS, ROUTINE W REFLEX MICROSCOPIC   Ct Abdomen Pelvis W Contrast  06/04/2012  *RADIOLOGY REPORT*  Clinical Data: Abdominal pain.  History of pancreatitis.  CT ABDOMEN AND PELVIS WITH CONTRAST  Technique:  Multidetector CT imaging of the abdomen and pelvis was performed following the standard protocol during bolus administration of intravenous contrast.  Contrast: OMNIPAQUE IOHEXOL 300 MG/ML  SOLN  Comparison: CT abdomen and pelvis 08/04/2011.  Findings: The lung bases are clear.  No pleural or pericardial effusion.  There is inflammatory change diffusely about the pancreas consistent with pancreatitis.  No focal fluid collection is identified.  The splenic and portal veins appear patent.  The pancreas enhances homogeneously.  There is mild low attenuation of the liver consistent with fatty infiltration.  No focal liver lesion.  The gallbladder, biliary tree, adrenal glands, spleen and kidneys appear normal.  The stomach, small and large bowel and appendix appear normal. The lymphadenopathy is identified.  There is no focal bony abnormality.  IMPRESSION: Findings consistent with acute pancreatitis.  Negative for pancreatic pseudocyst or evidence of pancreatic necrosis.  Original Report Authenticated By: Bernadene Bell. Maricela Curet, M.D.   Results for orders placed during the hospital encounter of 06/04/12  CBC WITH DIFFERENTIAL      Component Value Range   WBC 8.5  4.0 - 10.5 K/uL   RBC 4.57  4.22  - 5.81 MIL/uL   Hemoglobin 14.5  13.0 - 17.0 g/dL   HCT 09.8  11.9 - 14.7 %   MCV 92.3  78.0 - 100.0 fL   MCH 31.7  26.0 - 34.0 pg   MCHC 34.4  30.0 - 36.0 g/dL   RDW 82.9  56.2 - 13.0 %   Platelets 258  150 - 400 K/uL   Neutrophils Relative 69  43 - 77 %   Neutro Abs 5.9  1.7 - 7.7 K/uL   Lymphocytes Relative 17  12 - 46 %   Lymphs Abs 1.4  0.7 - 4.0 K/uL   Monocytes Relative 9  3 - 12 %   Monocytes Absolute 0.8  0.1 - 1.0 K/uL   Eosinophils Relative 4  0 - 5 %   Eosinophils Absolute 0.4  0.0 - 0.7 K/uL   Basophils Relative 1  0 - 1 %  Basophils Absolute 0.0  0.0 - 0.1 K/uL  COMPREHENSIVE METABOLIC PANEL      Component Value Range   Sodium 135  135 - 145 mEq/L   Potassium 4.0  3.5 - 5.1 mEq/L   Chloride 99  96 - 112 mEq/L   CO2 24  19 - 32 mEq/L   Glucose, Bld 115 (*) 70 - 99 mg/dL   BUN 13  6 - 23 mg/dL   Creatinine, Ser 1.61 (*) 0.50 - 1.35 mg/dL   Calcium 09.6  8.4 - 04.5 mg/dL   Total Protein 8.4 (*) 6.0 - 8.3 g/dL   Albumin 4.6  3.5 - 5.2 g/dL   AST 26  0 - 37 U/L   ALT 36  0 - 53 U/L   Alkaline Phosphatase 83  39 - 117 U/L   Total Bilirubin 1.1  0.3 - 1.2 mg/dL   GFR calc non Af Amer 65 (*) >90 mL/min   GFR calc Af Amer 76 (*) >90 mL/min  LIPASE, BLOOD      Component Value Range   Lipase 1344 (*) 11 - 59 U/L  URINALYSIS, ROUTINE W REFLEX MICROSCOPIC      Component Value Range   Color, Urine YELLOW  YELLOW   APPearance CLEAR  CLEAR   Specific Gravity, Urine 1.015  1.005 - 1.030   pH 6.0  5.0 - 8.0   Glucose, UA NEGATIVE  NEGATIVE mg/dL   Hgb urine dipstick NEGATIVE  NEGATIVE   Bilirubin Urine NEGATIVE  NEGATIVE   Ketones, ur NEGATIVE  NEGATIVE mg/dL   Protein, ur NEGATIVE  NEGATIVE mg/dL   Urobilinogen, UA 0.2  0.0 - 1.0 mg/dL   Nitrite NEGATIVE  NEGATIVE   Leukocytes, UA NEGATIVE  NEGATIVE    1. Pancreatitis       MDM   Findings are consistent with pancreatitis most likely alcohol related patient has been counseled is very important that he stop  drinking any alcohol. He is able to tolerate clear liquids he prefers to go home with pain medicine he will go on a clear liquid diet for the next 2 days and advance to a bland diet he'll return for any new or worse symptoms. Resource guide provided to help find primary care Dr.     I personally performed the services described in this documentation, which was scribed in my presence. The recorded information has been reviewed and considered.     Geoffrey Jakes, MD 06/04/12 [phone]

## 2012-06-05 ENCOUNTER — Inpatient Hospital Stay (HOSPITAL_COMMUNITY)
Admission: EM | Admit: 2012-06-05 | Discharge: 2012-06-09 | DRG: 439 | Disposition: A | Discharge status: Home / Self Care | Payer: 59 | Attending: Internal Medicine | Admitting: Internal Medicine

## 2012-06-05 ENCOUNTER — Encounter (HOSPITAL_COMMUNITY): Payer: Self-pay | Admitting: Emergency Medicine

## 2012-06-05 DIAGNOSIS — R509 Fever, unspecified: Secondary | ICD-10-CM | POA: Diagnosis not present

## 2012-06-05 DIAGNOSIS — N179 Acute kidney failure, unspecified: Secondary | ICD-10-CM

## 2012-06-05 DIAGNOSIS — Z87891 Personal history of nicotine dependence: Secondary | ICD-10-CM

## 2012-06-05 DIAGNOSIS — R946 Abnormal results of thyroid function studies: Secondary | ICD-10-CM | POA: Diagnosis present

## 2012-06-05 DIAGNOSIS — E860 Dehydration: Secondary | ICD-10-CM | POA: Diagnosis present

## 2012-06-05 DIAGNOSIS — R000 Tachycardia, unspecified: Secondary | ICD-10-CM | POA: Diagnosis not present

## 2012-06-05 DIAGNOSIS — E871 Hypo-osmolality and hyponatremia: Secondary | ICD-10-CM | POA: Diagnosis present

## 2012-06-05 DIAGNOSIS — K859 Acute pancreatitis without necrosis or infection, unspecified: Principal | ICD-10-CM | POA: Diagnosis present

## 2012-06-05 DIAGNOSIS — R3989 Other symptoms and signs involving the genitourinary system: Secondary | ICD-10-CM | POA: Diagnosis present

## 2012-06-05 DIAGNOSIS — I498 Other specified cardiac arrhythmias: Secondary | ICD-10-CM | POA: Diagnosis present

## 2012-06-05 DIAGNOSIS — F1010 Alcohol abuse, uncomplicated: Secondary | ICD-10-CM | POA: Diagnosis present

## 2012-06-05 DIAGNOSIS — I160 Hypertensive urgency: Secondary | ICD-10-CM | POA: Insufficient documentation

## 2012-06-05 DIAGNOSIS — D649 Anemia, unspecified: Secondary | ICD-10-CM

## 2012-06-05 DIAGNOSIS — I10 Essential (primary) hypertension: Secondary | ICD-10-CM | POA: Diagnosis present

## 2012-06-05 DIAGNOSIS — E876 Hypokalemia: Secondary | ICD-10-CM | POA: Diagnosis present

## 2012-06-05 DIAGNOSIS — R7309 Other abnormal glucose: Secondary | ICD-10-CM | POA: Diagnosis present

## 2012-06-05 DIAGNOSIS — D72829 Elevated white blood cell count, unspecified: Secondary | ICD-10-CM | POA: Diagnosis present

## 2012-06-05 DIAGNOSIS — K852 Alcohol induced acute pancreatitis without necrosis or infection: Secondary | ICD-10-CM | POA: Diagnosis present

## 2012-06-05 DIAGNOSIS — M109 Gout, unspecified: Secondary | ICD-10-CM

## 2012-06-05 HISTORY — DX: Essential (primary) hypertension: I10

## 2012-06-05 HISTORY — DX: Abnormal results of thyroid function studies: R94.6

## 2012-06-05 LAB — RAPID URINE DRUG SCREEN, HOSP PERFORMED
Barbiturates: NOT DETECTED
Benzodiazepines: NOT DETECTED
Cocaine: NOT DETECTED
Opiates: NOT DETECTED

## 2012-06-05 LAB — URINALYSIS, ROUTINE W REFLEX MICROSCOPIC
Ketones, ur: 15 mg/dL — AB
Protein, ur: 100 mg/dL — AB
Urobilinogen, UA: 0.2 mg/dL (ref 0.0–1.0)

## 2012-06-05 LAB — CBC WITH DIFFERENTIAL/PLATELET
Eosinophils Relative: 0 % (ref 0–5)
HCT: 39.5 % (ref 39.0–52.0)
Lymphocytes Relative: 4 % — ABNORMAL LOW (ref 12–46)
Lymphs Abs: 0.5 10*3/uL — ABNORMAL LOW (ref 0.7–4.0)
MCV: 92.7 fL (ref 78.0–100.0)
Monocytes Absolute: 0.5 10*3/uL (ref 0.1–1.0)
Monocytes Relative: 4 % (ref 3–12)
RBC: 4.26 MIL/uL (ref 4.22–5.81)
WBC: 13.4 10*3/uL — ABNORMAL HIGH (ref 4.0–10.5)

## 2012-06-05 LAB — URINE MICROSCOPIC-ADD ON

## 2012-06-05 LAB — COMPREHENSIVE METABOLIC PANEL
CO2: 24 mEq/L (ref 19–32)
Calcium: 9.5 mg/dL (ref 8.4–10.5)
Creatinine, Ser: 1.4 mg/dL — ABNORMAL HIGH (ref 0.50–1.35)
GFR calc Af Amer: 75 mL/min — ABNORMAL LOW (ref >90)
GFR calc non Af Amer: 64 mL/min — ABNORMAL LOW (ref >90)
Glucose, Bld: 140 mg/dL — ABNORMAL HIGH (ref 70–99)

## 2012-06-05 MED ORDER — SODIUM CHLORIDE 0.9 % IV SOLN
INTRAVENOUS | Status: DC
  Administered 2012-06-05: 19:00:00 via INTRAVENOUS

## 2012-06-05 MED ORDER — FENTANYL CITRATE 0.05 MG/ML IJ SOLN
50.0000 ug | INTRAMUSCULAR | Status: AC | PRN
  Administered 2012-06-05 (×2): 50 ug via INTRAVENOUS
  Filled 2012-06-05 (×2): qty 2

## 2012-06-05 MED ORDER — ENOXAPARIN SODIUM 40 MG/0.4ML ~~LOC~~ SOLN
40.0000 mg | SUBCUTANEOUS | Status: DC
  Administered 2012-06-05 – 2012-06-08 (×4): 40 mg via SUBCUTANEOUS
  Filled 2012-06-05 (×4): qty 0.4

## 2012-06-05 MED ORDER — HYDRALAZINE HCL 20 MG/ML IJ SOLN
10.0000 mg | INTRAMUSCULAR | Status: DC | PRN
  Administered 2012-06-05 – 2012-06-09 (×10): 10 mg via INTRAVENOUS
  Filled 2012-06-05 (×9): qty 1

## 2012-06-05 MED ORDER — THIAMINE HCL 100 MG/ML IJ SOLN
Freq: Once | INTRAVENOUS | Status: AC
  Administered 2012-06-05: via INTRAVENOUS
  Filled 2012-06-05: qty 1000

## 2012-06-05 MED ORDER — SODIUM CHLORIDE 0.9 % IV SOLN
INTRAVENOUS | Status: DC
  Administered 2012-06-06: 07:00:00 via INTRAVENOUS
  Filled 2012-06-05 (×6): qty 1000

## 2012-06-05 MED ORDER — FOLIC ACID 5 MG/ML IJ SOLN
INTRAMUSCULAR | Status: AC
  Filled 2012-06-05: qty 0.2

## 2012-06-05 MED ORDER — THIAMINE HCL 100 MG/ML IJ SOLN
INTRAMUSCULAR | Status: AC
  Filled 2012-06-05: qty 2

## 2012-06-05 MED ORDER — FENTANYL CITRATE 0.05 MG/ML IJ SOLN
50.0000 ug | INTRAMUSCULAR | Status: DC | PRN
  Administered 2012-06-05 – 2012-06-06 (×2): 50 ug via INTRAVENOUS
  Administered 2012-06-06: 100 ug via INTRAVENOUS
  Administered 2012-06-06 (×2): 50 ug via INTRAVENOUS
  Administered 2012-06-06: 100 ug via INTRAVENOUS
  Administered 2012-06-06: 50 ug via INTRAVENOUS
  Administered 2012-06-07: 100 ug via INTRAVENOUS
  Administered 2012-06-07 – 2012-06-08 (×5): 50 ug via INTRAVENOUS
  Administered 2012-06-08: 100 ug via INTRAVENOUS
  Administered 2012-06-08 (×4): 50 ug via INTRAVENOUS
  Administered 2012-06-09: 100 ug via INTRAVENOUS
  Administered 2012-06-09: 50 ug via INTRAVENOUS
  Filled 2012-06-05 (×21): qty 2

## 2012-06-05 MED ORDER — LORAZEPAM 2 MG/ML IJ SOLN
1.0000 mg | Freq: Four times a day (QID) | INTRAMUSCULAR | Status: AC | PRN
  Administered 2012-06-06: 1 mg via INTRAVENOUS
  Filled 2012-06-05: qty 1

## 2012-06-05 MED ORDER — ONDANSETRON HCL 4 MG/2ML IJ SOLN
4.0000 mg | INTRAMUSCULAR | Status: DC | PRN
  Administered 2012-06-05: 4 mg via INTRAVENOUS
  Filled 2012-06-05: qty 2

## 2012-06-05 MED ORDER — BISACODYL 10 MG RE SUPP: 10.0000 mg | Freq: Every day | RECTAL | Status: DC | PRN

## 2012-06-05 MED ORDER — HYDRALAZINE HCL 20 MG/ML IJ SOLN
INTRAMUSCULAR | Status: AC
  Filled 2012-06-05: qty 1

## 2012-06-05 MED ORDER — METOPROLOL TARTRATE 1 MG/ML IV SOLN
5.0000 mg | Freq: Four times a day (QID) | INTRAVENOUS | Status: DC
  Administered 2012-06-05: 5 mg via INTRAVENOUS
  Filled 2012-06-05: qty 5

## 2012-06-05 MED ORDER — FOLIC ACID 1 MG PO TABS
1.0000 mg | ORAL_TABLET | Freq: Every day | ORAL | Status: DC
  Administered 2012-06-06 – 2012-06-09 (×4): 1 mg via ORAL
  Filled 2012-06-05 (×5): qty 1

## 2012-06-05 MED ORDER — M.V.I. ADULT IV INJ
INJECTION | INTRAVENOUS | Status: AC
  Filled 2012-06-05: qty 10

## 2012-06-05 MED ORDER — FLEET ENEMA 7-19 GM/118ML RE ENEM: 1.0000 | ENEMA | Freq: Once | RECTAL | Status: AC | PRN

## 2012-06-05 MED ORDER — THIAMINE HCL 100 MG/ML IJ SOLN: 100.0000 mg | Freq: Every day | INTRAMUSCULAR | Status: DC

## 2012-06-05 MED ORDER — SODIUM CHLORIDE 0.9 % IJ SOLN
3.0000 mL | Freq: Two times a day (BID) | INTRAMUSCULAR | Status: DC
  Administered 2012-06-05 – 2012-06-08 (×4): 3 mL via INTRAVENOUS
  Filled 2012-06-05 (×2): qty 3

## 2012-06-05 MED ORDER — VITAMIN B-1 100 MG PO TABS
100.0000 mg | ORAL_TABLET | Freq: Every day | ORAL | Status: DC
  Administered 2012-06-06 – 2012-06-09 (×4): 100 mg via ORAL
  Filled 2012-06-05 (×5): qty 1

## 2012-06-05 MED ORDER — ONDANSETRON HCL 4 MG/2ML IJ SOLN: 4.0000 mg | INTRAMUSCULAR | Status: DC | PRN

## 2012-06-05 MED ORDER — LORAZEPAM 1 MG PO TABS
1.0000 mg | ORAL_TABLET | Freq: Four times a day (QID) | ORAL | Status: AC | PRN
  Administered 2012-06-08: 1 mg via ORAL
  Filled 2012-06-05 (×2): qty 1

## 2012-06-05 MED ORDER — LORAZEPAM 2 MG/ML IJ SOLN: 0.0000 mg | Freq: Four times a day (QID) | INTRAMUSCULAR | Status: AC

## 2012-06-05 MED ORDER — ADULT MULTIVITAMIN W/MINERALS CH
1.0000 | ORAL_TABLET | Freq: Every day | ORAL | Status: DC
  Administered 2012-06-06: 1 via ORAL
  Filled 2012-06-05: qty 1

## 2012-06-05 MED ORDER — SODIUM CHLORIDE 0.9 % IV BOLUS (SEPSIS)
2000.0000 mL | Freq: Once | INTRAVENOUS | Status: AC
  Administered 2012-06-05: 2000 mL via INTRAVENOUS

## 2012-06-05 MED ORDER — LORAZEPAM 2 MG/ML IJ SOLN: 0.0000 mg | Freq: Two times a day (BID) | INTRAMUSCULAR | Status: DC

## 2012-06-05 NOTE — H&P (Signed)
[facility] History and Physical  Geoffrey Conner WUJ:[id number] DOB: 19-Feb-1976 DOA: 06/05/2012    PCP:   Veneda Melter, MD , [facility], [place] [facility]   Chief Complaint:  Abdominal pain x 2 days  HPI: Geoffrey Conner is an 36 y.o. male.   African American gentleman lives with his mother in [place]. History of hypertension, alcohol abuse, alcoholic pancreatitis about one year ago. Drinks 3-4 fifths of vodka per weekend with friends, estimates he consumes about 2 fifths. After a weekend of drinking he developed  upper abdominal pain on Sunday, which became progressively more severe and he came to the emergency room yesterday. He was having no vomiting, and refused admission although he had biochemical evidence of acute pancreatitis, therefore was sent home with clear liquid diet with instructions how to advance. His pain got progressively worse and his mother and girlfriend brought him back to the emergency room today insisting that he be admitted.  Other than abdominal pain patient denies any other acute problem. He has not been involved in any detox program, nor is he involved in a support group to help him with his alcohol problem. He has a young son who was initially in the room with him at this interview.  Rewiew of Systems:   All systems negative except as marked bold or noted in the HPI;  Constitutional: Negative for malaise, fever and chills. ;  Eyes: Negative for eye pain, redness and discharge. ;  ENMT: Negative for ear pain, hoarseness, nasal congestion, sinus pressure and sore throat. ;  Cardiovascular: Negative for chest pain, palpitations, diaphoresis, dyspnea and peripheral edema. ;  Respiratory: Negative for cough, hemoptysis, wheezing and stridor. ;  Gastrointestinal: Negative for nausea, vomiting, diarrhea, constipation, abdominal pain, melena, blood in stool, hematemesis, jaundice and rectal bleeding. unusual weight loss..   Genitourinary: Negative for  frequency, dysuria, incontinence,flank pain and hematuria; Musculoskeletal: Negative for back pain and neck pain. Negative for swelling and trauma.;  Skin: . Negative for pruritus, rash, abrasions, bruising and skin lesion.; ulcerations Neuro: Negative for headache, lightheadedness and neck stiffness. Negative for weakness, altered level of consciousness , altered mental status, extremity weakness, burning feet, involuntary movement, seizure and syncope.  Psych: negative for anxiety, depression, insomnia, tearfulness, panic attacks, hallucinations, paranoia, suicidal or homicidal ideation    Past Medical History  Diagnosis Date  . Hypertension   . Pancreatitis oct  2012    Past Surgical History  Procedure Date  . No past surgeries     Medications:  HOME MEDS: Prior to Admission medications   Medication Sig Start Date End Date Taking? Authorizing Provider  atenolol (TENORMIN) 25 MG tablet Take 25 mg by mouth every morning.    Historical Provider, MD  oxyCODONE-acetaminophen (PERCOCET/ROXICET) 5-325 MG per tablet Take 2 tablets by mouth every 4 (four) hours as needed for pain. 06/04/12 06/14/12  [name], MD  oxyCODONE-acetaminophen (PERCOCET/ROXICET) 5-325 MG per tablet Take 1-2 tablets by mouth every 6 (six) hours as needed for pain. 06/04/12 06/14/12  [name], MD  promethazine (PHENERGAN) 25 MG tablet Take 1 tablet (25 mg total) by mouth every 6 (six) hours as needed for nausea. 06/04/12 06/11/12  [name], MD     Allergies:  Allergies  Allergen Reactions  . Peanut-Containing Drug Products Shortness Of Breath and Swelling  . Hydromorphone Itching    Social History:   reports that he has quit smoking. He does not have any smokeless tobacco history on file. He reports that  he drinks about 3 ounces of alcohol per week. He reports that he does not use illicit drugs.  Family History: Family History  Problem Relation Age of Onset  . Hypertension Mother     . Hypertension Father   . Cancer Mother     breast 9 yrs ago     Physical Exam: Filed Vitals:   06/05/12 1813 06/05/12 1814 06/05/12 2000  BP: 202/121  197/114  Pulse: 89  93  Temp: 98.9 F (37.2 C)    TempSrc: Oral    Resp: 20    Height:  5\' 11"  (1.803 m)   Weight:  81.194 kg (179 lb)   SpO2: 100%  95%   Blood pressure 197/114, pulse 93, temperature 98.9 F (37.2 C), temperature source Oral, resp. rate 20, height 5\' 11"  (1.803 m), weight 81.194 kg (179 lb), SpO2 95.00%.  GEN:  Quiet, stoic, African American gentleman lying in the stretcher; cooperative with exam PSYCH:  alert and oriented x4; appears apprehensive ; affect is distant. HEENT: Mucous membranes pink, dry and anicteric; PERRLA; EOM intact; no cervical lymphadenopathy nor thyromegaly or carotid bruit; no JVD; Breasts:: Not examined CHEST WALL: No tenderness CHEST: Normal respiration, clear to auscultation bilaterally HEART: Regular rate and rhythm; no murmurs rubs or gallops BACK: No kyphosis or scoliosis; no CVA tenderness ABDOMEN:  soft upper abdominal tenderness  no masses, no organomegaly, decreased but present bowel sounds; no pannus; no intertriginous candida. Rectal Exam: Not done EXTREMITIES: No bone or joint deformity; well-developed musculature; no edema; no ulcerations. Genitalia: not examined PULSES: 2+ and symmetric SKIN: Normal hydration no rash or ulceration CNS: Cranial nerves 2-12 grossly intact no focal lateralizing neurologic deficit   Labs on Admission:  Basic Metabolic Panel:  Lab 06/05/12 4098 06/04/12 1904  NA 133* 135  K 3.6 4.0  CL 96 99  CO2 24 24  GLUCOSE 140* 115*  BUN 8 13  CREATININE 1.40* 1.38*  CALCIUM 9.5 10.0  MG -- --  PHOS -- --   Liver Function Tests:  Lab 06/05/12 1858 06/04/12 1904  AST 20 26  ALT 23 36  ALKPHOS 77 83  BILITOT 0.9 1.1  PROT 8.0 8.4*  ALBUMIN 4.1 4.6    Lab 06/05/12 1858 06/04/12 1904  LIPASE 2470* 1344*  AMYLASE -- --   No results  found for this basename: AMMONIA:5 in the last 168 hours CBC:  Lab 06/05/12 1858 06/04/12 1904  WBC 13.4* 8.5  NEUTROABS 12.4* 5.9  HGB 13.6 14.5  HCT 39.5 42.2  MCV 92.7 92.3  PLT 208 258   Cardiac Enzymes: No results found for this basename: CKTOTAL:5,CKMB:5,CKMBINDEX:5,TROPONINI:5 in the last 168 hours BNP: No components found with this basename: POCBNP:5 CBG: No results found for this basename: GLUCAP:5 in the last 168 hours  Results for orders placed during the hospital encounter of 06/05/12 (from the past 48 hour(s))  CBC WITH DIFFERENTIAL     Status: Abnormal   Collection Time   06/05/12  6:58 PM      Component Value Range Comment   WBC 13.4 (*) 4.0 - 10.5 K/uL    RBC 4.26  4.22 - 5.81 MIL/uL    Hemoglobin 13.6  13.0 - 17.0 g/dL    HCT 11.9  14.7 - 82.9 %    MCV 92.7  78.0 - 100.0 fL    MCH 31.9  26.0 - 34.0 pg    MCHC 34.4  30.0 - 36.0 g/dL    RDW 56.2  13.0 -  15.5 %    Platelets 208  150 - 400 K/uL    Neutrophils Relative 92 (*) 43 - 77 %    Neutro Abs 12.4 (*) 1.7 - 7.7 K/uL    Lymphocytes Relative 4 (*) 12 - 46 %    Lymphs Abs 0.5 (*) 0.7 - 4.0 K/uL    Monocytes Relative 4  3 - 12 %    Monocytes Absolute 0.5  0.1 - 1.0 K/uL    Eosinophils Relative 0  0 - 5 %    Eosinophils Absolute 0.1  0.0 - 0.7 K/uL    Basophils Relative 0  0 - 1 %    Basophils Absolute 0.0  0.0 - 0.1 K/uL   COMPREHENSIVE METABOLIC PANEL     Status: Abnormal   Collection Time   06/05/12  6:58 PM      Component Value Range Comment   Sodium 133 (*) 135 - 145 mEq/L    Potassium 3.6  3.5 - 5.1 mEq/L    Chloride 96  96 - 112 mEq/L    CO2 24  19 - 32 mEq/L    Glucose, Bld 140 (*) 70 - 99 mg/dL    BUN 8  6 - 23 mg/dL    Creatinine, Ser 1.61 (*) 0.50 - 1.35 mg/dL    Calcium 9.5  8.4 - 09.6 mg/dL    Total Protein 8.0  6.0 - 8.3 g/dL    Albumin 4.1  3.5 - 5.2 g/dL    AST 20  0 - 37 U/L    ALT 23  0 - 53 U/L    Alkaline Phosphatase 77  39 - 117 U/L    Total Bilirubin 0.9  0.3 - 1.2 mg/dL     GFR calc non Af Amer 64 (*) >90 mL/min    GFR calc Af Amer 75 (*) >90 mL/min   LIPASE, BLOOD     Status: Abnormal   Collection Time   06/05/12  6:58 PM      Component Value Range Comment   Lipase 2470 (*) 11 - 59 U/L   ETHANOL     Status: Normal   Collection Time   06/05/12  6:58 PM      Component Value Range Comment   Alcohol, Ethyl (B) <11  0 - 11 mg/dL   URINALYSIS, ROUTINE W REFLEX MICROSCOPIC     Status: Abnormal   Collection Time   06/05/12  7:59 PM      Component Value Range Comment   Color, Urine YELLOW  YELLOW    APPearance CLEAR  CLEAR    Specific Gravity, Urine >1.030 (*) 1.005 - 1.030    pH 6.0  5.0 - 8.0    Glucose, UA NEGATIVE  NEGATIVE mg/dL    Hgb urine dipstick SMALL (*) NEGATIVE    Bilirubin Urine NEGATIVE  NEGATIVE    Ketones, ur 15 (*) NEGATIVE mg/dL    Protein, ur 045 (*) NEGATIVE mg/dL    Urobilinogen, UA 0.2  0.0 - 1.0 mg/dL    Nitrite NEGATIVE  NEGATIVE    Leukocytes, UA NEGATIVE  NEGATIVE   URINE RAPID DRUG SCREEN (HOSP PERFORMED)     Status: Normal   Collection Time   06/05/12  7:59 PM      Component Value Range Comment   Opiates NONE DETECTED  NONE DETECTED    Cocaine NONE DETECTED  NONE DETECTED    Benzodiazepines NONE DETECTED  NONE DETECTED    Amphetamines NONE DETECTED  NONE  DETECTED    Tetrahydrocannabinol NONE DETECTED  NONE DETECTED    Barbiturates NONE DETECTED  NONE DETECTED      Radiological Exams on Admission: Ct Abdomen Pelvis W Contrast  06/04/2012  *RADIOLOGY REPORT*  Clinical Data: Abdominal pain.  History of pancreatitis.  CT ABDOMEN AND PELVIS WITH CONTRAST  Technique:  Multidetector CT imaging of the abdomen and pelvis was performed following the standard protocol during bolus administration of intravenous contrast.  Contrast: OMNIPAQUE IOHEXOL 300 MG/ML  SOLN  Comparison: CT abdomen and pelvis 08/04/2011.  Findings: The lung bases are clear.  No pleural or pericardial effusion.  There is inflammatory change diffusely about the  pancreas consistent with pancreatitis.  No focal fluid collection is identified.  The splenic and portal veins appear patent.  The pancreas enhances homogeneously.  There is mild low attenuation of the liver consistent with fatty infiltration.  No focal liver lesion.  The gallbladder, biliary tree, adrenal glands, spleen and kidneys appear normal.  The stomach, small and large bowel and appendix appear normal. The lymphadenopathy is identified.  There is no focal bony abnormality.  IMPRESSION: Findings consistent with acute pancreatitis.  Negative for pancreatic pseudocyst or evidence of pancreatic necrosis.  Original Report Authenticated By: Bernadene Bell. Maricela Curet, M.D.     Assessment/Plan Present on Admission:  .Hypertensive urgency .Acute alcoholic pancreatitis .Alcohol abuse .Dehydration   PLAN: Admit this gentleman for management of his hypertensive urgency acute alcoholic hepatitis. Although he has bowel sounds, in view of the CT appearance, and his dehydration we will keep him n.p.o. and give IV fluid hydration, B vitamin supplementation, and monitor electrolytes.  Will treat hypertension with Lopressor and hydralazine intravenously until he is able to start oral medication.  Again counseled on the importance of alcohol cessation, and he getting involved with some type of support group to keep abstinent  Other plans as per orders.  Code Status: FULL CODE  Family Communication:  Mother, Geoffrey Conner [phone]    Geoffrey Conner Nocturnist [facility] Pager [phone]   06/05/2012, 8:56 PM

## 2012-06-05 NOTE — ED Provider Notes (Signed)
History     CSN: [id number]  Arrival date & time 06/05/12  1805   First MD Initiated Contact with Patient 06/05/12 1832      Chief Complaint  Patient presents with  . Abdominal Pain     HPI Pt was seen at 1835.  Per pt, c/o gradual onset and worsening of constant upper abd "pain" since yesterday.  Pt states he was eval in the ED for same last night, dx pancreatitis.  States he was offered admission but he wanted to go home.  States the pain medications are "not working" at home and he has come back to the ED today to be admitted.  Denies N/V/D, no back pain, no CP/SOB, no fevers.    Past Medical History  Diagnosis Date  . Hypertension   . Pancreatitis     Past Surgical History  Procedure Date  . No past surgeries     Family History  Problem Relation Age of Onset  . Hypertension Mother   . Hypertension Father   . Cancer Mother     breast 9 yrs ago    History  Substance Use Topics  . Smoking status: Former Games developer  . Smokeless tobacco: Not on file  . Alcohol Use: No     3-4 fifths of vodka per week with friends; 2 beers/day      Review of Systems ROS: Statement: All systems negative except as marked or noted in the HPI; Constitutional: Negative for fever and chills. ; ; Eyes: Negative for eye pain, redness and discharge. ; ; ENMT: Negative for ear pain, hoarseness, nasal congestion, sinus pressure and sore throat. ; ; Cardiovascular: Negative for chest pain, palpitations, diaphoresis, dyspnea and peripheral edema. ; ; Respiratory: Negative for cough, wheezing and stridor. ; ; Gastrointestinal: +abd pain. Negative for nausea, vomiting, diarrhea, blood in stool, hematemesis, jaundice and rectal bleeding. . ; ; Genitourinary: Negative for dysuria, flank pain and hematuria. ; ; Musculoskeletal: Negative for back pain and neck pain. Negative for swelling and trauma.; ; Skin: Negative for pruritus, rash, abrasions, blisters, bruising and skin lesion.; ; Neuro: Negative for  headache, lightheadedness and neck stiffness. Negative for weakness, altered level of consciousness , altered mental status, extremity weakness, paresthesias, involuntary movement, seizure and syncope.       Allergies  Peanut-containing drug products and Hydromorphone  Home Medications   Current Outpatient Rx  Name Route Sig Dispense Refill  . ATENOLOL 25 MG PO TABS Oral Take 25 mg by mouth every morning.    . OXYCODONE-ACETAMINOPHEN 5-325 MG PO TABS Oral Take 2 tablets by mouth every 4 (four) hours as needed for pain. 6 tablet 0  . OXYCODONE-ACETAMINOPHEN 5-325 MG PO TABS Oral Take 1-2 tablets by mouth every 6 (six) hours as needed for pain. 15 tablet 0  . PROMETHAZINE HCL 25 MG PO TABS Oral Take 1 tablet (25 mg total) by mouth every 6 (six) hours as needed for nausea. 12 tablet 0    BP 202/121  Pulse 89  Temp 98.9 F (37.2 C) (Oral)  Resp 20  Ht 5\' 11"  (1.803 m)  Wt 179 lb (81.194 kg)  BMI 24.97 kg/m2  SpO2 100%  Physical Exam 1840: Physical examination:  Nursing notes reviewed; Vital signs and O2 SAT reviewed;  Constitutional: Well developed, Well nourished, Well hydrated, Uncomfortable appearing.; Head:  Normocephalic, atraumatic; Eyes: EOMI, PERRL, No scleral icterus; ENMT: Mouth and pharynx normal, Mucous membranes moist; Neck: Supple, Full range of motion, No lymphadenopathy; Cardiovascular: Regular rate  and rhythm, No murmur, rub, or gallop; Respiratory: Breath sounds clear & equal bilaterally, No rales, rhonchi, wheezes.  Speaking full sentences with ease, Normal respiratory effort/excursion; Chest: Nontender, Movement normal; Abdomen: Soft, +mid-epigastric abd tender to palp.  No rebound or guarding. Nondistended, Normal bowel sounds; Genitourinary: No CVA tenderness; Extremities: Pulses normal, No tenderness, No edema, No calf edema or asymmetry.; Neuro: AA&Ox3, Major CN grossly intact.  Speech clear. No gross focal motor or sensory deficits in extremities.; Skin: Color  normal, Warm, Dry.   ED Course  Procedures    MDM  MDM Reviewed: previous chart, nursing note and vitals Reviewed previous: CT scan, labs and ECG Interpretation: labs and ECG   Ct Abdomen Pelvis W Contrast 06/04/2012  *RADIOLOGY REPORT*  Clinical Data: Abdominal pain.  History of pancreatitis.  CT ABDOMEN AND PELVIS WITH CONTRAST  Technique:  Multidetector CT imaging of the abdomen and pelvis was performed following the standard protocol during bolus administration of intravenous contrast.  Contrast: OMNIPAQUE IOHEXOL 300 MG/ML  SOLN  Comparison: CT abdomen and pelvis 08/04/2011.  Findings: The lung bases are clear.  No pleural or pericardial effusion.  There is inflammatory change diffusely about the pancreas consistent with pancreatitis.  No focal fluid collection is identified.  The splenic and portal veins appear patent.  The pancreas enhances homogeneously.  There is mild low attenuation of the liver consistent with fatty infiltration.  No focal liver lesion.  The gallbladder, biliary tree, adrenal glands, spleen and kidneys appear normal.  The stomach, small and large bowel and appendix appear normal. The lymphadenopathy is identified.  There is no focal bony abnormality.  IMPRESSION: Findings consistent with acute pancreatitis.  Negative for pancreatic pseudocyst or evidence of pancreatic necrosis.  Original Report Authenticated By: Bernadene Bell. Maricela Curet, M.D.      Date: 06/05/2012  Rate: 78  Rhythm: normal sinus rhythm and sinus arrhythmia  QRS Axis: normal  Intervals: normal  ST/T Wave abnormalities: normal  Conduction Disutrbances:none  Narrative Interpretation:   Old EKG Reviewed: changes noted; previous EKG dated 08/02/2011 with sinus tachycardia and NS STTW changes.  Results for orders placed during the hospital encounter of 06/05/12  CBC WITH DIFFERENTIAL      Component Value Range   WBC 13.4 (*) 4.0 - 10.5 K/uL   RBC 4.26  4.22 - 5.81 MIL/uL   Hemoglobin 13.6  13.0 -  17.0 g/dL   HCT 16.1  09.6 - 04.5 %   MCV 92.7  78.0 - 100.0 fL   MCH 31.9  26.0 - 34.0 pg   MCHC 34.4  30.0 - 36.0 g/dL   RDW 40.9  81.1 - 91.4 %   Platelets 208  150 - 400 K/uL   Neutrophils Relative 92 (*) 43 - 77 %   Neutro Abs 12.4 (*) 1.7 - 7.7 K/uL   Lymphocytes Relative 4 (*) 12 - 46 %   Lymphs Abs 0.5 (*) 0.7 - 4.0 K/uL   Monocytes Relative 4  3 - 12 %   Monocytes Absolute 0.5  0.1 - 1.0 K/uL   Eosinophils Relative 0  0 - 5 %   Eosinophils Absolute 0.1  0.0 - 0.7 K/uL   Basophils Relative 0  0 - 1 %   Basophils Absolute 0.0  0.0 - 0.1 K/uL  COMPREHENSIVE METABOLIC PANEL      Component Value Range   Sodium 133 (*) 135 - 145 mEq/L   Potassium 3.6  3.5 - 5.1 mEq/L   Chloride 96  96 - 112 mEq/L   CO2 24  19 - 32 mEq/L   Glucose, Bld 140 (*) 70 - 99 mg/dL   BUN 8  6 - 23 mg/dL   Creatinine, Ser 1.61 (*) 0.50 - 1.35 mg/dL   Calcium 9.5  8.4 - 09.6 mg/dL   Total Protein 8.0  6.0 - 8.3 g/dL   Albumin 4.1  3.5 - 5.2 g/dL   AST 20  0 - 37 U/L   ALT 23  0 - 53 U/L   Alkaline Phosphatase 77  39 - 117 U/L   Total Bilirubin 0.9  0.3 - 1.2 mg/dL   GFR calc non Af Amer 64 (*) >90 mL/min   GFR calc Af Amer 75 (*) >90 mL/min  LIPASE, BLOOD      Component Value Range   Lipase 2470 (*) 11 - 59 U/L  ETHANOL      Component Value Range   Alcohol, Ethyl (B) <11  0 - 11 mg/dL    Results for MAL, ASHER (MRN [id number]) as of 06/05/2012 20:10  Ref. Range 06/04/2012 19:04 06/05/2012 18:58  Lipase Latest Range: 11-59 U/L 1344 (H) 2470 (H)     2015:  Dx testing d/w pt and family.  Questions answered.  Verb understanding, agreeable to admit.  T/C to [facility]. Orvan Falconer, case discussed, including:  HPI, pertinent PM/SHx, VS/PE, dx testing, ED course and treatment:  Agreeable to admit, requests to write temporary orders, obtain tele bed.     Laray Anger, DO 06/06/12 1534

## 2012-06-05 NOTE — ED Notes (Signed)
On rounding, pt states that the pain medication helped initially but now pain has returned, MD made aware, medication repeated for pain.

## 2012-06-05 NOTE — ED Notes (Signed)
Pt c/o epigastric and luq abd pain today. Denies n/v/d. Pt seen in ed last night and dx with pancreatitis. Pt here for pain control.

## 2012-06-06 ENCOUNTER — Inpatient Hospital Stay (HOSPITAL_COMMUNITY): Payer: 59

## 2012-06-06 DIAGNOSIS — R509 Fever, unspecified: Secondary | ICD-10-CM | POA: Diagnosis not present

## 2012-06-06 DIAGNOSIS — D72829 Elevated white blood cell count, unspecified: Secondary | ICD-10-CM | POA: Diagnosis not present

## 2012-06-06 LAB — LIPID PANEL
Cholesterol: 181 mg/dL (ref 0–200)
Total CHOL/HDL Ratio: 2.4 RATIO

## 2012-06-06 LAB — COMPREHENSIVE METABOLIC PANEL
AST: 22 U/L (ref 0–37)
CO2: 22 mEq/L (ref 19–32)
Calcium: 9 mg/dL (ref 8.4–10.5)
Creatinine, Ser: 1.21 mg/dL (ref 0.50–1.35)
GFR calc non Af Amer: 77 mL/min — ABNORMAL LOW (ref >90)
Total Protein: 7.5 g/dL (ref 6.0–8.3)

## 2012-06-06 LAB — CBC
MCH: 32.3 pg (ref 26.0–34.0)
MCHC: 35.1 g/dL (ref 30.0–36.0)
MCV: 92.2 fL (ref 78.0–100.0)
Platelets: 204 10*3/uL (ref 150–400)
RBC: 4.21 MIL/uL — ABNORMAL LOW (ref 4.22–5.81)
RDW: 13.3 % (ref 11.5–15.5)

## 2012-06-06 LAB — APTT: aPTT: 35 seconds (ref 24–37)

## 2012-06-06 LAB — PROTIME-INR: INR: 1.11 (ref 0.00–1.49)

## 2012-06-06 MED ORDER — ACETAMINOPHEN 10 MG/ML IV SOLN
1000.0000 mg | Freq: Once | INTRAVENOUS | Status: AC
  Administered 2012-06-06: 1000 mg via INTRAVENOUS
  Filled 2012-06-06: qty 100

## 2012-06-06 MED ORDER — METOPROLOL TARTRATE 1 MG/ML IV SOLN
10.0000 mg | Freq: Four times a day (QID) | INTRAVENOUS | Status: DC
  Administered 2012-06-06 – 2012-06-08 (×8): 10 mg via INTRAVENOUS
  Filled 2012-06-06: qty 5
  Filled 2012-06-06 (×8): qty 10

## 2012-06-06 MED ORDER — POTASSIUM CHLORIDE IN NACL 40-0.9 MEQ/L-% IV SOLN
INTRAVENOUS | Status: DC
  Administered 2012-06-06 – 2012-06-07 (×2): via INTRAVENOUS
  Filled 2012-06-06 (×3): qty 1000

## 2012-06-06 MED ORDER — CLONIDINE HCL 0.1 MG/24HR TD PTWK
0.1000 mg | MEDICATED_PATCH | TRANSDERMAL | Status: DC
  Administered 2012-06-06: 0.1 mg via TRANSDERMAL
  Filled 2012-06-06: qty 1

## 2012-06-06 MED ORDER — POTASSIUM CHLORIDE IN NACL 20-0.9 MEQ/L-% IV SOLN: INTRAVENOUS | Status: DC

## 2012-06-06 MED ORDER — PIPERACILLIN-TAZOBACTAM 3.375 G IVPB
3.3750 g | Freq: Three times a day (TID) | INTRAVENOUS | Status: DC
  Administered 2012-06-06 – 2012-06-09 (×9): 3.375 g via INTRAVENOUS
  Filled 2012-06-06 (×16): qty 50

## 2012-06-06 MED ORDER — ACETAMINOPHEN 10 MG/ML IV SOLN
INTRAVENOUS | Status: AC
  Filled 2012-06-06: qty 100

## 2012-06-06 MED ORDER — METOPROLOL TARTRATE 1 MG/ML IV SOLN
10.0000 mg | Freq: Once | INTRAVENOUS | Status: AC
  Administered 2012-06-06: 10 mg via INTRAVENOUS
  Filled 2012-06-06: qty 10

## 2012-06-06 MED ORDER — ACETAMINOPHEN 325 MG PO TABS
650.0000 mg | ORAL_TABLET | Freq: Once | ORAL | Status: AC
  Administered 2012-06-06: 650 mg via ORAL
  Filled 2012-06-06: qty 2

## 2012-06-06 NOTE — Progress Notes (Signed)
Patient's temperature was 102.8. Doctor was made aware and new orders were given. Will recheck temperature after IV tylenol is done.

## 2012-06-06 NOTE — Progress Notes (Signed)
Utilization Review Complete  

## 2012-06-06 NOTE — Clinical Social Work Psychosocial (Signed)
    Clinical Social Work Department BRIEF PSYCHOSOCIAL ASSESSMENT 06/06/2012  Patient:  Geoffrey Conner, Geoffrey Conner     Account Number:  [id number]     Admit date:  06/05/2012  Clinical Social Worker:  Geoffrey Conner, CLINICAL SOCIAL WORKER  Date/Time:  06/06/2012 11:00 AM  Referred by:  Physician  Date Referred:  06/05/2012 Referred for  Substance Abuse   Other Referral:   Interview type:  Patient Other interview type:    PSYCHOSOCIAL DATA Living Status:  FAMILY Admitted from facility:   Level of care:   Primary support name:  Geoffrey Conner Primary support relationship to patient:  PARENT Degree of support available:   Supportive and concerned    CURRENT CONCERNS Current Concerns  Substance Abuse   Other Concerns:    SOCIAL WORK ASSESSMENT / PLAN CSW met w patient at bedside, patient alert and oriented. Administered SBIRT - patient scored at 17.  Patient wants referrals to outpatient SA treatment resources in [place] area.    Patient states that he began drinking at approx age 67. Did not like drinking at first, but now drinks 7 - 10 shots of vodka 2 - 3 days a week with his friends.  Drinks primarily on weekends.  Longest period of sobriety is 3 - 4 months after October 2012 hospitalization for pancreatitis. Patient resumed drinking, 2 shots at first, then increasing monthly to current level of drinking which he estimates at 7 - 10 shots/day 2 - 3 days/week.  This past weekend, patient states that he shared a fifth of vodka w 3 friends on Friday and Saturday.  Says this is the most alcohol he has ever consumed on one occasion. Patient states that he likes alcohol because it allows him to forget about his stress, relaxes him, and drinks w his friends.  The negatives include making him "lazy", "I don't want to do anything" and the negative medical consequences, including the pain of pancreatitis.    When asked about readiness to make changes, patient stated he was a "10" on a scale of  1-10, and wanted to make plan to maintain abstinence on outpatient basis.  Quit for 3 - 4 months after last bout w panreatitis, but was unable to maintain sobriety long term.    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 Has rotating schedule, working 4 12 hour shifts one week and 2 12 hours shifts the next week, and has mandatory overtime.  Patient lives in [place].  Has son (6) who is cared for by child's mother, patient visits as often as possible.  He also has a daughter, 43, who lives w her mother in [place].   Assessment/plan status:  Psychosocial Support/Ongoing Assessment of Needs Other assessment/ plan:   CSW will explore possibility of providing patient w contact information for AA member in his area to introduce 12 step programs.   Information/referral to community resources:   Rethinking Drinking pamphlet  Linkage to 12 step meetings in [place], [place]    PATIENT'S/FAMILY'S RESPONSE TO PLAN OF CARE: Patient appreciative of support   Geoffrey Conner Clinical Social Worker [phone])

## 2012-06-06 NOTE — Progress Notes (Signed)
Subjective: Pain a little better.  No vomiting.  No cough, no dysuria  Objective: Vital signs in last 24 hours: Filed Vitals:   06/06/12 0546 06/06/12 1029 06/06/12 1346 06/06/12 1458  BP: 156/99 170/111 172/113 154/93  Pulse: 119 106 106 118  Temp:  98.8 F (37.1 C) 98.7 F (37.1 C)   TempSrc:      Resp: 20 18 20 20   Height:      Weight: 81.285 kg (179 lb 3.2 oz)     SpO2: 99% 97% 99%    Weight change:   Intake/Output Summary (Last 24 hours) at 06/06/12 1503 Last data filed at 06/06/12 1300  Gross per 24 hour  Intake    900 ml  Output   1725 ml  Net   -825 ml   Gen:  Non toxic, comfortable Lungs CTA without WRR CV RRR without MGR Abd. Normal BS, S, NT, ND Ext no cce  Lab Results: Basic Metabolic Panel:  Lab 06/06/12 6962 06/05/12 1858  NA 133* 133*  K 3.4* 3.6  CL 98 96  CO2 22 24  GLUCOSE 113* 140*  BUN 5* 8  CREATININE 1.21 1.40*  CALCIUM 9.0 9.5  MG 1.5 --  PHOS -- --   Liver Function Tests:  Lab 06/06/12 0245 06/05/12 1858  AST 22 20  ALT 21 23  ALKPHOS 77 77  BILITOT 0.9 0.9  PROT 7.5 8.0  ALBUMIN 3.7 4.1    Lab 06/06/12 0230 06/05/12 1858  LIPASE 1534* 2470*  AMYLASE -- --   No results found for this basename: AMMONIA:2 in the last 168 hours CBC:  Lab 06/06/12 0245 06/05/12 1858 06/04/12 1904  WBC 16.2* 13.4* --  NEUTROABS -- 12.4* 5.9  HGB 13.6 13.6 --  HCT 38.8* 39.5 --  MCV 92.2 92.7 --  PLT 204 208 --   Cardiac Enzymes: No results found for this basename: CKTOTAL:3,CKMB:3,CKMBINDEX:3,TROPONINI:3 in the last 168 hours BNP: No results found for this basename: PROBNP:3 in the last 168 hours D-Dimer: No results found for this basename: DDIMER:2 in the last 168 hours CBG: No results found for this basename: GLUCAP:6 in the last 168 hours Hemoglobin A1C: No results found for this basename: HGBA1C in the last 168 hours Fasting Lipid Panel:  Lab 06/06/12 0225  CHOL 181  HDL 75  LDLCALC 92  TRIG 69  CHOLHDL 2.4  LDLDIRECT  --   Thyroid Function Tests: No results found for this basename: TSH,T4TOTAL,FREET4,T3FREE,THYROIDAB in the last 168 hours Coagulation:  Lab 06/06/12 0245  LABPROT 14.5  INR 1.11   Anemia Panel: No results found for this basename: VITAMINB12,FOLATE,FERRITIN,TIBC,IRON,RETICCTPCT in the last 168 hours Urine Drug Screen: Drugs of Abuse     Component Value Date/Time   LABOPIA NONE DETECTED 06/05/2012 1959   COCAINSCRNUR NONE DETECTED 06/05/2012 1959   LABBENZ NONE DETECTED 06/05/2012 1959   AMPHETMU NONE DETECTED 06/05/2012 1959   THCU NONE DETECTED 06/05/2012 1959   LABBARB NONE DETECTED 06/05/2012 1959    Alcohol Level:  Lab 06/05/12 1858  ETH <11   Urinalysis:  Lab 06/05/12 1959 06/04/12 1946  COLORURINE YELLOW YELLOW  LABSPEC >1.030* 1.015  PHURINE 6.0 6.0  GLUCOSEU NEGATIVE NEGATIVE  HGBUR SMALL* NEGATIVE  BILIRUBINUR NEGATIVE NEGATIVE  KETONESUR 15* NEGATIVE  PROTEINUR 100* NEGATIVE  UROBILINOGEN 0.2 0.2  NITRITE NEGATIVE NEGATIVE  LEUKOCYTESUR NEGATIVE NEGATIVE   Micro Results: Recent Results (from the past 240 hour(s))  CULTURE, BLOOD (ROUTINE X 2)     Status: Normal (Preliminary result)  Collection Time   06/06/12  2:24 AM      Component Value Range Status Comment   Specimen Description BLOOD LEFT HAND   Final    Special Requests BOTTLES DRAWN AEROBIC ONLY 5CC   Final    Culture NO GROWTH <24 HRS   Final    Report Status PENDING   Incomplete   CULTURE, BLOOD (ROUTINE X 2)     Status: Normal (Preliminary result)   Collection Time   06/06/12  2:45 AM      Component Value Range Status Comment   Specimen Description BLOOD LEFT ANTECUBITAL   Final    Special Requests     Final    Value: BOTTLES DRAWN AEROBIC AND ANAEROBIC AEB=4CC ANA=5CC   Culture NO GROWTH <24 HRS   Final    Report Status PENDING   Incomplete    Studies/Results: Ct Abdomen Pelvis W Contrast  06/04/2012  *RADIOLOGY REPORT*  Clinical Data: Abdominal pain.  History of pancreatitis.  CT ABDOMEN  AND PELVIS WITH CONTRAST  Technique:  Multidetector CT imaging of the abdomen and pelvis was performed following the standard protocol during bolus administration of intravenous contrast.  Contrast: OMNIPAQUE IOHEXOL 300 MG/ML  SOLN  Comparison: CT abdomen and pelvis 08/04/2011.  Findings: The lung bases are clear.  No pleural or pericardial effusion.  There is inflammatory change diffusely about the pancreas consistent with pancreatitis.  No focal fluid collection is identified.  The splenic and portal veins appear patent.  The pancreas enhances homogeneously.  There is mild low attenuation of the liver consistent with fatty infiltration.  No focal liver lesion.  The gallbladder, biliary tree, adrenal glands, spleen and kidneys appear normal.  The stomach, small and large bowel and appendix appear normal. The lymphadenopathy is identified.  There is no focal bony abnormality.  IMPRESSION: Findings consistent with acute pancreatitis.  Negative for pancreatic pseudocyst or evidence of pancreatic necrosis.  Original Report Authenticated By: Bernadene Bell. D'ALESSIO, M.D.   Scheduled Meds:   . acetaminophen  1,000 mg Intravenous Once  . cloNIDine  0.1 mg Transdermal Weekly  . enoxaparin (LOVENOX) injection  40 mg Subcutaneous Q24H  . folic acid  1 mg Oral Daily  . LORazepam  0-4 mg Intravenous Q6H   Followed by  . LORazepam  0-4 mg Intravenous Q12H  . metoprolol  10 mg Intravenous Q6H  . piperacillin-tazobactam (ZOSYN)  IV  3.375 g Intravenous Q8H  . sodium chloride  2,000 mL Intravenous Once   Followed by  . banana bag IV fluid 1000 mL   Intravenous Once  . sodium chloride  3 mL Intravenous Q12H  . thiamine  100 mg Oral Daily   Or  . thiamine  100 mg Intravenous Daily  . DISCONTD: metoprolol  5 mg Intravenous Q6H  . DISCONTD: multivitamin with minerals  1 tablet Oral Daily   Continuous Infusions:   . 0.9 % NaCl with KCl 40 mEq / L    . DISCONTD: sodium chloride Stopped (06/05/12 2119)  .  DISCONTD: 0.9 % NaCl with KCl 20 mEq / L 125 mL/hr at 06/06/12 0905  . DISCONTD: sodium chloride 0.9 % 1,000 mL with potassium chloride 20 mEq infusion 125 mL/hr at 06/06/12 0721   PRN Meds:.bisacodyl, fentaNYL, fentaNYL, hydrALAZINE, LORazepam, LORazepam, ondansetron (ZOFRAN) IV, sodium phosphate, DISCONTD: ondansetron Assessment/Plan: Principal Problem:  *Acute alcoholic pancreatitis Active Problems:  Hypertensive urgency  Dehydration  Fever  Leukocytosis  Alcohol abuse  Patient had a fever last night of  10 9F. Will check a chest x-ray. Urinalysis negative. Blood cultures pending. Also has a leukocytosis. Both may be caused by pancreatitis, but will just cover with empiric antibiotics for now. May have ice chips. Follow pancreatic enzymes. Blood pressure still high. Will add clonidine patch. Needs to quit drinking. Will consult social work.   LOS: 1 day   Dawnita Molner L 06/06/2012, 3:03 PM

## 2012-06-06 NOTE — Progress Notes (Signed)
Patient's BP 165/112, HR 133, prn Hydralazine 10mg  IV given at 1945, patient also has temp 101.3, no prn tylenol, called Dr. Onalee Hua, new orders given to give scheduled 0000 lopressor IV now, and tylenol 650mg  po once now.

## 2012-06-07 ENCOUNTER — Encounter (HOSPITAL_COMMUNITY): Payer: Self-pay | Admitting: Internal Medicine

## 2012-06-07 DIAGNOSIS — N179 Acute kidney failure, unspecified: Secondary | ICD-10-CM | POA: Diagnosis present

## 2012-06-07 DIAGNOSIS — E876 Hypokalemia: Secondary | ICD-10-CM | POA: Diagnosis not present

## 2012-06-07 DIAGNOSIS — E871 Hypo-osmolality and hyponatremia: Secondary | ICD-10-CM | POA: Diagnosis present

## 2012-06-07 DIAGNOSIS — R000 Tachycardia, unspecified: Secondary | ICD-10-CM | POA: Diagnosis not present

## 2012-06-07 DIAGNOSIS — I10 Essential (primary) hypertension: Secondary | ICD-10-CM

## 2012-06-07 LAB — CBC WITH DIFFERENTIAL/PLATELET
Eosinophils Absolute: 0.1 10*3/uL (ref 0.0–0.7)
Eosinophils Relative: 0 % (ref 0–5)
HCT: 39.2 % (ref 39.0–52.0)
Hemoglobin: 13.5 g/dL (ref 13.0–17.0)
Lymphocytes Relative: 8 % — ABNORMAL LOW (ref 12–46)
Lymphs Abs: 1.3 10*3/uL (ref 0.7–4.0)
MCH: 32.1 pg (ref 26.0–34.0)
MCV: 93.3 fL (ref 78.0–100.0)
Monocytes Absolute: 1.1 10*3/uL — ABNORMAL HIGH (ref 0.1–1.0)
Monocytes Relative: 7 % (ref 3–12)
Platelets: 167 10*3/uL (ref 150–400)
RBC: 4.2 MIL/uL — ABNORMAL LOW (ref 4.22–5.81)
WBC: 16.1 10*3/uL — ABNORMAL HIGH (ref 4.0–10.5)

## 2012-06-07 LAB — BASIC METABOLIC PANEL
BUN: 8 mg/dL (ref 6–23)
CO2: 21 mEq/L (ref 19–32)
Calcium: 8.9 mg/dL (ref 8.4–10.5)
GFR calc non Af Amer: 65 mL/min — ABNORMAL LOW (ref >90)
Glucose, Bld: 78 mg/dL (ref 70–99)
Sodium: 138 mEq/L (ref 135–145)

## 2012-06-07 LAB — TSH: TSH: 0.289 u[IU]/mL — ABNORMAL LOW (ref 0.350–4.500)

## 2012-06-07 LAB — URINE CULTURE: Culture: NO GROWTH

## 2012-06-07 MED ORDER — POTASSIUM CHLORIDE IN NACL 20-0.9 MEQ/L-% IV SOLN
INTRAVENOUS | Status: DC
  Administered 2012-06-07 – 2012-06-08 (×4): via INTRAVENOUS

## 2012-06-07 MED ORDER — CLONIDINE HCL 0.2 MG/24HR TD PTWK: 0.2000 mg | MEDICATED_PATCH | TRANSDERMAL | Status: DC

## 2012-06-07 NOTE — Clinical Social Work Note (Signed)
CSW met w patient at bedside.  Gave him name of AA volunteer to call to get information on AA meetings and support in [place].  Also assisted patient to call his insurance carrier to get information on referrals for outpatient substance abuse treatment.  Per Clent Ridges, these include [facility], [facility], [facility].  Patient states that he will call 12 step volunteer, and CSW gave patient contact information for treatment resources.  CSW will continue to follow patient and encourage him to make appointment w outpatient treatment.  [name] Clinical Social Worker [phone])

## 2012-06-07 NOTE — Progress Notes (Signed)
Encouraged pt to ambulate in hallway. Pt verbalized understanding and stated he would walk later.

## 2012-06-07 NOTE — Progress Notes (Signed)
Subjective: Patient says that his epigastric abdominal pain is approximately 4/5. He had a normal bowel movement this morning. He has had no nausea vomiting. When asked, he does not believe he has a problem with alcohol abuse but admits he drinks a half of a fifth to a fifth of alcohol 2-3 days weekly with his friends.   Objective: Vital signs in last 24 hours: Filed Vitals:   06/07/12 0200 06/07/12 0500 06/07/12 0511 06/07/12 0512  BP: 151/104 159/119 153/110 144/108  Pulse: 114 142 134 144  Temp: 98.8 F (37.1 C) 98.6 F (37 C)    TempSrc: Oral Oral    Resp: 18 20 20 20   Height:      Weight:  70 kg (154 lb 5.2 oz)    SpO2: 98% 100% 100% 90%    Intake/Output Summary (Last 24 hours) at 06/07/12 0901 Last data filed at 06/07/12 0522  Gross per 24 hour  Intake   1812 ml  Output   1150 ml  Net    662 ml    Weight change: -11.194 kg (-24 lb 10.9 oz)  Physical exam: General: Pleasant 36 year old African-American man laying in bed, in no acute distress. Lungs: Clear to auscultation bilaterally. Heart: S1, S2, with tachycardia. Abdomen: Positive bowel sounds, soft, mildly tender in the epigastrium, no appreciable distention and no appreciable masses palpated. Extremities: Pedal pulses palpable. No pedal edema. Neurologic/psychiatric: He is alert and oriented x3. His speech is clear. Although he is tachycardic, he does not appear to be tremulous.   Lab Results: Basic Metabolic Panel:  Basename 06/07/12 0431 06/06/12 0245  NA 138 133*  K 4.0 3.4*  CL 101 98  CO2 21 22  GLUCOSE 78 113*  BUN 8 5*  CREATININE 1.39* 1.21  CALCIUM 8.9 9.0  MG -- 1.5  PHOS -- --   Liver Function Tests:  [facility] 06/06/12 0245 06/05/12 1858  AST 22 20  ALT 21 23  ALKPHOS 77 77  BILITOT 0.9 0.9  PROT 7.5 8.0  ALBUMIN 3.7 4.1    Basename 06/07/12 0431 06/06/12 0230  LIPASE 116* 1534*  AMYLASE -- --   No results found for this basename: AMMONIA:2 in the last 72 hours CBC:  Basename  06/07/12 0431 06/06/12 0245 06/05/12 1858  WBC 16.1* 16.2* --  NEUTROABS 13.6* -- 12.4*  HGB 13.5 13.6 --  HCT 39.2 38.8* --  MCV 93.3 92.2 --  PLT 167 204 --   Cardiac Enzymes: No results found for this basename: CKTOTAL:3,CKMB:3,CKMBINDEX:3,TROPONINI:3 in the last 72 hours BNP: No results found for this basename: PROBNP:3 in the last 72 hours D-Dimer: No results found for this basename: DDIMER:2 in the last 72 hours CBG: No results found for this basename: GLUCAP:6 in the last 72 hours Hemoglobin A1C:  Basename 06/06/12 0245  HGBA1C 5.6   Fasting Lipid Panel:  Basename 06/06/12 0225  CHOL 181  HDL 75  LDLCALC 92  TRIG 69  CHOLHDL 2.4  LDLDIRECT --   Thyroid Function Tests: No results found for this basename: TSH,T4TOTAL,FREET4,T3FREE,THYROIDAB in the last 72 hours Anemia Panel: No results found for this basename: VITAMINB12,FOLATE,FERRITIN,TIBC,IRON,RETICCTPCT in the last 72 hours Coagulation:  Basename 06/06/12 0245  LABPROT 14.5  INR 1.11   Urine Drug Screen: Drugs of Abuse     Component Value Date/Time   LABOPIA NONE DETECTED 06/05/2012 1959   COCAINSCRNUR NONE DETECTED 06/05/2012 1959   LABBENZ NONE DETECTED 06/05/2012 1959   AMPHETMU NONE DETECTED 06/05/2012 1959   THCU NONE  DETECTED 06/05/2012 1959   LABBARB NONE DETECTED 06/05/2012 1959    Alcohol Level:  Basename 06/05/12 1858  ETH <11   Urinalysis:  Basename 06/05/12 1959 06/04/12 1946  COLORURINE YELLOW YELLOW  LABSPEC >1.030* 1.015  PHURINE 6.0 6.0  GLUCOSEU NEGATIVE NEGATIVE  HGBUR SMALL* NEGATIVE  BILIRUBINUR NEGATIVE NEGATIVE  KETONESUR 15* NEGATIVE  PROTEINUR 100* NEGATIVE  UROBILINOGEN 0.2 0.2  NITRITE NEGATIVE NEGATIVE  LEUKOCYTESUR NEGATIVE NEGATIVE   Misc. Labs:   Micro: Recent Results (from the past 240 hour(s))  CULTURE, BLOOD (ROUTINE X 2)     Status: Normal (Preliminary result)   Collection Time   06/06/12  2:24 AM      Component Value Range Status Comment   Specimen  Description BLOOD LEFT HAND   Final    Special Requests BOTTLES DRAWN AEROBIC ONLY 5CC   Final    Culture NO GROWTH <24 HRS   Final    Report Status PENDING   Incomplete   CULTURE, BLOOD (ROUTINE X 2)     Status: Normal (Preliminary result)   Collection Time   06/06/12  2:45 AM      Component Value Range Status Comment   Specimen Description BLOOD LEFT ANTECUBITAL   Final    Special Requests     Final    Value: BOTTLES DRAWN AEROBIC AND ANAEROBIC AEB=4CC ANA=5CC   Culture NO GROWTH <24 HRS   Final    Report Status PENDING   Incomplete     Studies/Results: Dg Chest 2 View  06/06/2012  *RADIOLOGY REPORT*  Clinical Data: Fever, pancreatitis, leukocytosis.  CHEST - 2 VIEW  Comparison: PA and lateral chest 08/02/2011.  Findings: Mild linear mass in the right lung base is consistent with atelectasis.  The left lung is clear.  Heart size is normal. No pneumothorax or pleural effusion.  IMPRESSION: No acute finding.  Mild right lower lobe atelectasis.  Original Report Authenticated By: Bernadene Bell. Maricela Curet, M.D.    Medications:  Scheduled:    . acetaminophen  650 mg Oral Once  . cloNIDine  0.1 mg Transdermal Weekly  . enoxaparin (LOVENOX) injection  40 mg Subcutaneous Q24H  . folic acid  1 mg Oral Daily  . LORazepam  0-4 mg Intravenous Q6H   Followed by  . LORazepam  0-4 mg Intravenous Q12H  . metoprolol  10 mg Intravenous Q6H  . metoprolol  10 mg Intravenous Once  . piperacillin-tazobactam (ZOSYN)  IV  3.375 g Intravenous Q8H  . sodium chloride  3 mL Intravenous Q12H  . thiamine  100 mg Oral Daily   Or  . thiamine  100 mg Intravenous Daily  . DISCONTD: multivitamin with minerals  1 tablet Oral Daily   Continuous:    . 0.9 % NaCl with KCl 40 mEq / L 125 mL/hr at 06/07/12 0758  . DISCONTD: 0.9 % NaCl with KCl 20 mEq / L 125 mL/hr at 06/06/12 1610   RUE:[id number], fentaNYL, hydrALAZINE, LORazepam, LORazepam, ondansetron (ZOFRAN) IV  Assessment: Principal Problem:  *Acute  alcoholic pancreatitis Active Problems:  Alcohol abuse  Hypertensive urgency  Dehydration  Fever  Leukocytosis  ARF (acute renal failure)  Tachycardia   1. Acute alcoholic pancreatitis. He is less symptomatic. His lipase is decreasing aggressively.  Fever/febrile illness. Etiology could be inflammatory from the acute pancreatitis. His chest x-ray is not indicative of pneumonia in his urinalysis is not indicative of urinary tract infection. We'll continue Zosyn empirically. Blood cultures have been ordered and are negative so far.  Leukocytosis. As above. We'll continue Zosyn empirically.  Alcohol abuse. The patient appears to be binge drinking. He does not believe he has a problem with alcoholism. He is tachycardic, which may be in part secondary to volume depletion and fever, but impending alcohol withdrawal syndrome is also a concern. He is not tremulous or disoriented. We'll continue alcohol withdrawal protocol and vitamin therapy. Clinical social worker, Ms. [name] has discussed community resources to help with his sobriety. The patient appears receptive.  Malignant hypertension. We'll continue Catapres patch and IV metoprolol. Will resume atenolol orally as he improves clinically.  Acute renal failure. Likely secondary to acute pancreatitis and dehydration.  Hyperglycemia. Resolved. Hemoglobin A1c is 5.6.   Hyponatremia and hypokalemia. Resolved with IV fluid hydration and supplementation.   Plan: 1. The patient was encouraged to stop drinking completely. 2. We'll order a TSH/free T4 for evaluation of tachycardia and malignant hypertension. 3. Will start sips of clear liquids only. 4. Out of bed to the chair today.   LOS: 2 days   Jayson Waterhouse 06/07/2012, 9:01 AM

## 2012-06-08 ENCOUNTER — Encounter (HOSPITAL_COMMUNITY): Payer: Self-pay | Admitting: Internal Medicine

## 2012-06-08 DIAGNOSIS — R946 Abnormal results of thyroid function studies: Secondary | ICD-10-CM

## 2012-06-08 HISTORY — DX: Abnormal results of thyroid function studies: R94.6

## 2012-06-08 LAB — COMPREHENSIVE METABOLIC PANEL
AST: 17 U/L (ref 0–37)
Albumin: 3 g/dL — ABNORMAL LOW (ref 3.5–5.2)
Alkaline Phosphatase: 144 U/L — ABNORMAL HIGH (ref 39–117)
BUN: 7 mg/dL (ref 6–23)
CO2: 19 mEq/L (ref 19–32)
Chloride: 103 mEq/L (ref 96–112)
GFR calc non Af Amer: 83 mL/min — ABNORMAL LOW (ref >90)
Potassium: 3.7 mEq/L (ref 3.5–5.1)
Total Bilirubin: 0.8 mg/dL (ref 0.3–1.2)

## 2012-06-08 LAB — CBC
MCH: 31.8 pg (ref 26.0–34.0)
MCV: 93.2 fL (ref 78.0–100.0)
Platelets: 173 10*3/uL (ref 150–400)
RDW: 13.6 % (ref 11.5–15.5)
WBC: 12.5 10*3/uL — ABNORMAL HIGH (ref 4.0–10.5)

## 2012-06-08 LAB — LIPASE, BLOOD: Lipase: 82 U/L — ABNORMAL HIGH (ref 11–59)

## 2012-06-08 MED ORDER — CLONIDINE HCL 0.2 MG PO TABS
0.2000 mg | ORAL_TABLET | Freq: Every day | ORAL | Status: DC
  Administered 2012-06-08 – 2012-06-09 (×2): 0.2 mg via ORAL
  Filled 2012-06-08 (×2): qty 1

## 2012-06-08 MED ORDER — ATENOLOL 25 MG PO TABS
50.0000 mg | ORAL_TABLET | Freq: Every day | ORAL | Status: DC
  Administered 2012-06-08 – 2012-06-09 (×2): 50 mg via ORAL
  Filled 2012-06-08: qty 2
  Filled 2012-06-08: qty 1
  Filled 2012-06-08 (×2): qty 2

## 2012-06-08 NOTE — Progress Notes (Signed)
Subjective: Patient denies abdominal pain currently. No nausea vomiting. He tolerated the clear liquid diet without any increase in pain. He denies headache or dizziness.   Objective: Vital signs in last 24 hours: Filed Vitals:   06/08/12 0200 06/08/12 0500 06/08/12 0510 06/08/12 0515  BP: 195/124 171/111 176/117 181/127  Pulse: 94 111 94 96  Temp: 98.6 F (37 C) 98.3 F (36.8 C)    TempSrc: Oral Oral    Resp: 20 20 20 20   Height:      Weight:  81.8 kg (180 lb 5.4 oz)    SpO2: 99% 99% 99% 100%    Intake/Output Summary (Last 24 hours) at 06/08/12 1043 Last data filed at 06/08/12 0454  Gross per 24 hour  Intake   3184 ml  Output    250 ml  Net   2934 ml    Weight change: 11.8 kg (26 lb 0.2 oz)  Physical exam: General: Pleasant 36 year old African-American man laying in bed, in no acute distress. Lungs: Clear to auscultation bilaterally. Heart: S1, S2, with tachycardia. Abdomen: Positive bowel sounds, soft, mildly tender in the epigastrium, no appreciable distention and no appreciable masses palpated. Extremities: Pedal pulses palpable. No pedal edema. Neurologic/psychiatric: He is alert and oriented x3. His speech is clear. Although he is tachycardic, he does not appear to be tremulous.   Lab Results: Basic Metabolic Panel:  Basename 06/08/12 0514 06/07/12 0431 06/06/12 0245  NA 135 138 --  K 3.7 4.0 --  CL 103 101 --  CO2 19 21 --  GLUCOSE 96 78 --  BUN 7 8 --  CREATININE 1.13 1.39* --  CALCIUM 8.9 8.9 --  MG -- -- 1.5  PHOS -- -- --   Liver Function Tests:  [facility] 06/08/12 0514 06/06/12 0245  AST 17 22  ALT 20 21  ALKPHOS 144* 77  BILITOT 0.8 0.9  PROT 7.0 7.5  ALBUMIN 3.0* 3.7    Basename 06/08/12 0530 06/07/12 0431  LIPASE 82* 116*  AMYLASE -- --   No results found for this basename: AMMONIA:2 in the last 72 hours CBC:  Basename 06/08/12 0514 06/07/12 0431 06/05/12 1858  WBC 12.5* 16.1* --  NEUTROABS -- 13.6* 12.4*  HGB 12.1* 13.5 --  HCT  35.5* 39.2 --  MCV 93.2 93.3 --  PLT 173 167 --   Cardiac Enzymes: No results found for this basename: CKTOTAL:3,CKMB:3,CKMBINDEX:3,TROPONINI:3 in the last 72 hours BNP: No results found for this basename: PROBNP:3 in the last 72 hours D-Dimer: No results found for this basename: DDIMER:2 in the last 72 hours CBG: No results found for this basename: GLUCAP:6 in the last 72 hours Hemoglobin A1C:  Basename 06/06/12 0245  HGBA1C 5.6   Fasting Lipid Panel:  Basename 06/06/12 0225  CHOL 181  HDL 75  LDLCALC 92  TRIG 69  CHOLHDL 2.4  LDLDIRECT --   Thyroid Function Tests:  Basename 06/07/12 0930  TSH 0.289*  T4TOTAL --  FREET4 1.63  T3FREE --  THYROIDAB --   Anemia Panel: No results found for this basename: VITAMINB12,FOLATE,FERRITIN,TIBC,IRON,RETICCTPCT in the last 72 hours Coagulation:  Basename 06/06/12 0245  LABPROT 14.5  INR 1.11   Urine Drug Screen: Drugs of Abuse     Component Value Date/Time   LABOPIA NONE DETECTED 06/05/2012 1959   COCAINSCRNUR NONE DETECTED 06/05/2012 1959   LABBENZ NONE DETECTED 06/05/2012 1959   AMPHETMU NONE DETECTED 06/05/2012 1959   THCU NONE DETECTED 06/05/2012 1959   LABBARB NONE DETECTED 06/05/2012 1959  Alcohol Level:  Basename 06/05/12 1858  ETH <11   Urinalysis:  Basename 06/05/12 1959  COLORURINE YELLOW  LABSPEC >1.030*  PHURINE 6.0  GLUCOSEU NEGATIVE  HGBUR SMALL*  BILIRUBINUR NEGATIVE  KETONESUR 15*  PROTEINUR 100*  UROBILINOGEN 0.2  NITRITE NEGATIVE  LEUKOCYTESUR NEGATIVE   Misc. Labs:   Micro: Recent Results (from the past 240 hour(s))  URINE CULTURE     Status: Normal   Collection Time   06/05/12  7:59 PM      Component Value Range Status Comment   Specimen Description URINE, CLEAN CATCH   Final    Special Requests NONE   Final    Culture  Setup Time 06/06/2012 15:15   Final    Colony Count NO GROWTH   Final    Culture NO GROWTH   Final    Report Status 06/07/2012 FINAL   Final   CULTURE, BLOOD  (ROUTINE X 2)     Status: Normal (Preliminary result)   Collection Time   06/06/12  2:24 AM      Component Value Range Status Comment   Specimen Description BLOOD LEFT HAND   Final    Special Requests BOTTLES DRAWN AEROBIC ONLY 5CC   Final    Culture NO GROWTH 2 DAYS   Final    Report Status PENDING   Incomplete   CULTURE, BLOOD (ROUTINE X 2)     Status: Normal (Preliminary result)   Collection Time   06/06/12  2:45 AM      Component Value Range Status Comment   Specimen Description BLOOD LEFT ANTECUBITAL   Final    Special Requests     Final    Value: BOTTLES DRAWN AEROBIC AND ANAEROBIC AEB=4CC ANA=5CC   Culture NO GROWTH 2 DAYS   Final    Report Status PENDING   Incomplete     Studies/Results: Dg Chest 2 View  06/06/2012  *RADIOLOGY REPORT*  Clinical Data: Fever, pancreatitis, leukocytosis.  CHEST - 2 VIEW  Comparison: PA and lateral chest 08/02/2011.  Findings: Mild linear mass in the right lung base is consistent with atelectasis.  The left lung is clear.  Heart size is normal. No pneumothorax or pleural effusion.  IMPRESSION: No acute finding.  Mild right lower lobe atelectasis.  Original Report Authenticated By: Bernadene Bell. Maricela Curet, M.D.    Medications:  Scheduled:    . atenolol  50 mg Oral Daily  . cloNIDine  0.2 mg Transdermal Weekly  . enoxaparin (LOVENOX) injection  40 mg Subcutaneous Q24H  . folic acid  1 mg Oral Daily  . LORazepam  0-4 mg Intravenous Q6H   Followed by  . LORazepam  0-4 mg Intravenous Q12H  . piperacillin-tazobactam (ZOSYN)  IV  3.375 g Intravenous Q8H  . sodium chloride  3 mL Intravenous Q12H  . thiamine  100 mg Oral Daily   Or  . thiamine  100 mg Intravenous Daily  . DISCONTD: cloNIDine  0.1 mg Transdermal Weekly  . DISCONTD: metoprolol  10 mg Intravenous Q6H   Continuous:    . 0.9 % NaCl with KCl 20 mEq / L 75 mL/hr at 06/08/12 1610   RUE:[id number], fentaNYL, hydrALAZINE, LORazepam, LORazepam, ondansetron (ZOFRAN)  IV  Assessment: Principal Problem:  *Acute alcoholic pancreatitis Active Problems:  HTN (hypertension), malignant  Alcohol abuse  Dehydration  Fever  Leukocytosis  ARF (acute renal failure)  Tachycardia  Hyponatremia  Hypokalemia  Abnormal thyroid function test   1. Acute alcoholic pancreatitis. He is less symptomatic. His  lipase is decreasing progressively.  Fever/febrile illness. He has been afebrile for 24 hours now. Etiology could be inflammatory from the acute pancreatitis. His chest x-ray is not indicative of pneumonia in his urinalysis is not indicative of urinary tract infection. We'll continue Zosyn empirically. Blood cultures are negative so far.  Leukocytosis. As above. We'll continue Zosyn empirically. His white count is decreasing.  Alcohol abuse. The patient appears to be binge drinking. He does not believe he has a problem with alcoholism. No signs of alcohol withdrawal syndrome. He is less tachycardic. He has no signs of tremulousness. Clinical social worker, Ms. [name] has discussed community resources to help with his sobriety. The patient appears receptive.  Malignant hypertension. We'll continue Catapres patch and resume atenolol. We'll continue when necessary IV hydralazine.  Acute renal failure. Likely secondary to acute pancreatitis and dehydration.  Hyperglycemia. Resolved. Hemoglobin A1c is 5.6.   Hyponatremia and hypokalemia. Resolved with IV fluid hydration and supplementation.  Low TSH and normal free T4.  Plan: 1. Clear liquid diet was started. We'll advance to full liquid diet. 2. We'll decrease the IV fluids. 3. Atenolol has been restarted. We'll change Catapres to by mouth. 4. Possible discharge tomorrow.   LOS: 3 days   Geoffrey Conner 06/08/2012, 10:43 AM

## 2012-06-09 DIAGNOSIS — D649 Anemia, unspecified: Secondary | ICD-10-CM | POA: Diagnosis not present

## 2012-06-09 DIAGNOSIS — R946 Abnormal results of thyroid function studies: Secondary | ICD-10-CM

## 2012-06-09 LAB — COMPREHENSIVE METABOLIC PANEL
ALT: 32 U/L (ref 0–53)
AST: 32 U/L (ref 0–37)
Albumin: 2.8 g/dL — ABNORMAL LOW (ref 3.5–5.2)
Alkaline Phosphatase: 137 U/L — ABNORMAL HIGH (ref 39–117)
BUN: 6 mg/dL (ref 6–23)
CO2: 22 mEq/L (ref 19–32)
Calcium: 9 mg/dL (ref 8.4–10.5)
Chloride: 103 mEq/L (ref 96–112)
Creatinine, Ser: 1.25 mg/dL (ref 0.50–1.35)
GFR calc Af Amer: 86 mL/min — ABNORMAL LOW (ref >90)
GFR calc non Af Amer: 74 mL/min — ABNORMAL LOW (ref >90)
Glucose, Bld: 139 mg/dL — ABNORMAL HIGH (ref 70–99)
Potassium: 3.6 mEq/L (ref 3.5–5.1)
Sodium: 135 mEq/L (ref 135–145)
Total Bilirubin: 0.6 mg/dL (ref 0.3–1.2)
Total Protein: 6.7 g/dL (ref 6.0–8.3)

## 2012-06-09 LAB — CBC
HCT: 33.8 % — ABNORMAL LOW (ref 39.0–52.0)
Hemoglobin: 11.6 g/dL — ABNORMAL LOW (ref 13.0–17.0)
RBC: 3.66 MIL/uL — ABNORMAL LOW (ref 4.22–5.81)
RDW: 13.3 % (ref 11.5–15.5)
WBC: 6.8 10*3/uL (ref 4.0–10.5)

## 2012-06-09 LAB — LIPASE, BLOOD: Lipase: 82 U/L — ABNORMAL HIGH (ref 11–59)

## 2012-06-09 MED ORDER — THIAMINE HCL 100 MG PO TABS: 100.0000 mg | ORAL_TABLET | Freq: Every day | ORAL | Status: AC

## 2012-06-09 MED ORDER — AMOXICILLIN-POT CLAVULANATE 500-125 MG PO TABS: 1.0000 | ORAL_TABLET | Freq: Two times a day (BID) | ORAL | Status: AC

## 2012-06-09 MED ORDER — ATENOLOL 50 MG PO TABS: 50.0000 mg | ORAL_TABLET | ORAL | Status: AC

## 2012-06-09 MED ORDER — OXYCODONE-ACETAMINOPHEN 5-325 MG PO TABS: 1.0000 | ORAL_TABLET | Freq: Four times a day (QID) | ORAL | Status: AC | PRN

## 2012-06-09 NOTE — Progress Notes (Signed)
Patient provided with discharge instructions and prescriptions.  Pt verbalizes understanding of d/c instructions and need to schedule physician follow-up.  Pt provided with note for return to work.  Pt stable at time of d/c.  Transported by NT via w/c for d/c.

## 2012-06-09 NOTE — Clinical Social Work Note (Signed)
CSW talked with patient regarding the treatment information given to him by CSW [name] and encouraged his follow-up after discharge. Patient expressed appreciation for the information given to him and shared that he will be participating in a court-ordered [facility] program in [place]. This is a program for persons involved in the court system and in need of addiction treatment for drug/alcohol problems.  CSW provided support and encouragement to patient. CSW signing off as Mr. is being discharged today.  Genelle Bal, MSW, LCSW [phone]

## 2012-06-09 NOTE — Discharge Summary (Signed)
Physician Discharge Summary  Geoffrey Conner ZOX:[id number] DOB: May 14, 1976 DOA: 06/05/2012  PCP: Geoffrey Melter, MD [facility] [place]).  Admit date: 06/05/2012 Discharge date: 06/09/2012  Recommendations for Outpatient Follow-up:  1. The patient was discharged to home in improved condition. He will followup with his primary care physician in [place] and 1-2 weeks.  Discharge Diagnoses:   1. Acute alcoholic pancreatitis. The patient's lipase was 1344 on admission and 82 at the time of discharge. 2. Alcohol abuse. 3. Febrile illness, likely secondary to acute pancreatitis. 4. Leukocytosis secondary to acute pancreatitis. 5. Dehydration. 6. Acute renal failure secondary to prerenal azotemia. 7. Sinus tachycardia secondary to fever and dehydration. 8. Hypokalemia and hyponatremia, supplemented and resolved. 9. Malignant hypertension. 10. Low TSH and normal free T4. 11. Anemia secondary to the dilutional effects of IV fluids and acute illness. His hemoglobin was 14.5 on admission and 11.6 at the time of discharge. 12. Transient hyperglycemia. Hemoglobin A1c 5.6.  Discharge Condition: Improved.  Diet recommendation: Low-fat.  Filed Weights   06/07/12 0500 06/08/12 0500 06/09/12 0548  Weight: 70 kg (154 lb 5.2 oz) 81.8 kg (180 lb 5.4 oz) 83 kg (182 lb 15.7 oz)    History of present illness:  The patient is a 36 year old man with a history significant for hypertension, alcohol abuse, alcoholic pancreatitis, who presented to the emergency department on 06/04/2012 with a chief complaint of abdominal pain. In the emergency department, he was afebrile and hypertensive with a blood pressure 197/114. His lab data were significant for lipase of 1344 but was repeated at 2470 on admission. His serum sodium was 133, creatinine 1.40, glucose 140, normal LFTs, and a white blood cell count of 13.4. His alcohol level was less than 11. CT of his abdomen and pelvis revealed findings consistent  with acute pancreatitis and normal gallbladder. He was admitted for further evaluation and management.  Hospital Course:  The patient was started on IV fluids with normal saline with potassium chloride added for hydration. He was made to be n.p.o. for bowel rest. IV analgesics and IV antiemetics were started for symptomatic relief. The Ativan alcohol withdrawal protocol and vitamin therapy was started. To treat his malignant hypertension, he was started on IV metoprolol and IV hydralazine. Subsequently, Catapres patch was started. He developed a fever. Blood cultures, chest x-ray, and urinalysis were ordered. He was started empirically on Zosyn. His urinalysis and chest x-ray were not indicative of infection. His blood cultures remained negative. It was believed that his fever was secondary to acute pancreatitis. For further evaluation, a number of studies were ordered. His fasting lipid panel was within normal limits, specifically, his triglyceride level was only 69. His A1c was 5.6. His TSH was slightly low at 0.29 but his free T4 was within normal limits at 1.63.  The clinical social worker was consulted for further all of services that would be available to help with his sobriety. Ms. Geoffrey Conner provided the patient with services such as AA, [facility], [facility], etc., to assist the patient with sobriety. The patient was receptive. He showed no signs of alcohol withdrawal syndrome.  The patient improved clinically and symptomatically. His diet was advanced which he tolerated well. His fever resolved. His white blood cell count normalized. His lipase decreased to 82. His renal function normalized. His tachycardia normalized. IV antihypertensive medications were discontinued. He was restarted on atenolol. Upon discharge, the Catapres patch was discontinued and the dose of atenolol was increased to 50 mg daily which is double his  usual home dose. He was instructed to avoid alcohol use and to  follow a low fat diet. He voiced understanding.  Procedures:  None  Consultations:  None  Discharge Exam: Filed Vitals:   06/09/12 0955  BP: 141/79  Pulse: 96  Temp:   Resp:    Filed Vitals:   06/09/12 0550 06/09/12 0552 06/09/12 0920 06/09/12 0955  BP: 158/93 159/105  141/79  Pulse: 91 92 96 96  Temp:      TempSrc:      Resp:      Height:      Weight:      SpO2:   98% 99%    General: Alert 36 year old African-American man, lying in bed in no acute distress. Cardiovascular: S1, S2, with no murmurs rubs or gallops. Respiratory: Clear to auscultation bilaterally. Abdomen: Positive bowel sounds, soft, nontender, nondistended. Neurologic: He is alert and oriented x3. Speech is clear. No tremulousness.  Discharge Instructions  Discharge Orders    Future Orders Please Complete By Expires   Diet - low sodium heart healthy      Increase activity slowly      Discharge instructions      Comments:   Follow a low fat diet over the next several days. Avoid eating high-fat foods. Eat baked and broiled foods.Do not drink alcohol. Followup with those services that will help with your sobriety discussed with the social worker.  Because of your high blood pressure, the dose of atenolol was increased to 50 mg daily.     Medication List  As of 06/09/2012 10:33 AM   TAKE these medications         amoxicillin-clavulanate 500-125 MG per tablet   Commonly known as: AUGMENTIN   Take 1 tablet (500 mg total) by mouth 2 (two) times daily before a meal. Antibiotic to be taken for 5 more days.      atenolol 50 MG tablet   Commonly known as: TENORMIN   Take 1 tablet (50 mg total) by mouth every morning.      oxyCODONE-acetaminophen 5-325 MG per tablet   Commonly known as: PERCOCET/ROXICET   Take 1-2 tablets by mouth every 6 (six) hours as needed for pain.      promethazine 25 MG tablet   Commonly known as: PHENERGAN   Take 1 tablet (25 mg total) by mouth every 6 (six) hours as  needed for nausea.      thiamine 100 MG tablet   Take 1 tablet (100 mg total) by mouth daily.           Follow-up Information    Please follow up. (FOLLOWUP WITH YOUR PRIMARY CARE PHYSICIAN IN ONE WEEK.)           The results of significant diagnostics from this hospitalization (including imaging, microbiology, ancillary and laboratory) are listed below for reference.    Significant Diagnostic Studies: Dg Chest 2 View  06/06/2012  *RADIOLOGY REPORT*  Clinical Data: Fever, pancreatitis, leukocytosis.  CHEST - 2 VIEW  Comparison: PA and lateral chest 08/02/2011.  Findings: Mild linear mass in the right lung base is consistent with atelectasis.  The left lung is clear.  Heart size is normal. No pneumothorax or pleural effusion.  IMPRESSION: No acute finding.  Mild right lower lobe atelectasis.  Original Report Authenticated By: Bernadene Bell. Geoffrey Conner, M.D.   Ct Abdomen Pelvis W Contrast  06/04/2012  *RADIOLOGY REPORT*  Clinical Data: Abdominal pain.  History of pancreatitis.  CT ABDOMEN AND PELVIS WITH CONTRAST  Technique:  Multidetector CT imaging of the abdomen and pelvis was performed following the standard protocol during bolus administration of intravenous contrast.  Contrast: OMNIPAQUE IOHEXOL 300 MG/ML  SOLN  Comparison: CT abdomen and pelvis 08/04/2011.  Findings: The lung bases are clear.  No pleural or pericardial effusion.  There is inflammatory change diffusely about the pancreas consistent with pancreatitis.  No focal fluid collection is identified.  The splenic and portal veins appear patent.  The pancreas enhances homogeneously.  There is mild low attenuation of the liver consistent with fatty infiltration.  No focal liver lesion.  The gallbladder, biliary tree, adrenal glands, spleen and kidneys appear normal.  The stomach, small and large bowel and appendix appear normal. The lymphadenopathy is identified.  There is no focal bony abnormality.  IMPRESSION: Findings consistent  with acute pancreatitis.  Negative for pancreatic pseudocyst or evidence of pancreatic necrosis.  Original Report Authenticated By: Bernadene Bell. Geoffrey Conner, M.D.    Microbiology: Recent Results (from the past 240 hour(s))  URINE CULTURE     Status: Normal   Collection Time   06/05/12  7:59 PM      Component Value Range Status Comment   Specimen Description URINE, CLEAN CATCH   Final    Special Requests NONE   Final    Culture  Setup Time 06/06/2012 15:15   Final    Colony Count NO GROWTH   Final    Culture NO GROWTH   Final    Report Status 06/07/2012 FINAL   Final   CULTURE, BLOOD (ROUTINE X 2)     Status: Normal (Preliminary result)   Collection Time   06/06/12  2:24 AM      Component Value Range Status Comment   Specimen Description BLOOD LEFT HAND   Final    Special Requests BOTTLES DRAWN AEROBIC ONLY 5CC   Final    Culture NO GROWTH 3 DAYS   Final    Report Status PENDING   Incomplete   CULTURE, BLOOD (ROUTINE X 2)     Status: Normal (Preliminary result)   Collection Time   06/06/12  2:45 AM      Component Value Range Status Comment   Specimen Description BLOOD LEFT ANTECUBITAL   Final    Special Requests     Final    Value: BOTTLES DRAWN AEROBIC AND ANAEROBIC AEB=4CC ANA=5CC   Culture NO GROWTH 3 DAYS   Final    Report Status PENDING   Incomplete      Labs: Basic Metabolic Panel:  Lab 06/09/12 1610 06/08/12 0514 06/07/12 0431 06/06/12 0245 06/05/12 1858  NA 135 135 138 133* 133*  K 3.6 3.7 4.0 3.4* 3.6  CL 103 103 101 98 96  CO2 22 19 21 22 24   GLUCOSE 139* 96 78 113* 140*  BUN 6 7 8  5* 8  CREATININE 1.25 1.13 1.39* 1.21 1.40*  CALCIUM 9.0 8.9 8.9 9.0 9.5  MG -- -- -- 1.5 --  PHOS -- -- -- -- --   Liver Function Tests:  Lab 06/09/12 0640 06/08/12 0514 06/06/12 0245 06/05/12 1858 06/04/12 1904  AST 32 17 22 20 26   ALT 32 20 21 23  36  ALKPHOS 137* 144* 77 77 83  BILITOT 0.6 0.8 0.9 0.9 1.1  PROT 6.7 7.0 7.5 8.0 8.4*  ALBUMIN 2.8* 3.0* 3.7 4.1 4.6    Lab  06/09/12 0640 06/08/12 0530 06/07/12 0431 06/06/12 0230 06/05/12 1858  LIPASE 82* 82* 116* 1534* 2470*  AMYLASE -- -- -- -- --  No results found for this basename: AMMONIA:5 in the last 168 hours CBC:  Lab 06/09/12 0640 06/08/12 0514 06/07/12 0431 06/06/12 0245 06/05/12 1858 06/04/12 1904  WBC 6.8 12.5* 16.1* 16.2* 13.4* --  NEUTROABS -- -- 13.6* -- 12.4* 5.9  HGB 11.6* 12.1* 13.5 13.6 13.6 --  HCT 33.8* 35.5* 39.2 38.8* 39.5 --  MCV 92.3 93.2 93.3 92.2 92.7 --  PLT 202 173 167 204 208 --   Cardiac Enzymes: No results found for this basename: CKTOTAL:5,CKMB:5,CKMBINDEX:5,TROPONINI:5 in the last 168 hours BNP: BNP (last 3 results) No results found for this basename: PROBNP:3 in the last 8760 hours CBG: No results found for this basename: GLUCAP:5 in the last 168 hours  Time coordinating discharge: Greater than 30 minutes  Signed:  Katheline Brendlinger  [facility] 06/09/2012, 10:33 AM

## 2012-06-11 LAB — CULTURE, BLOOD (ROUTINE X 2)

## 2012-06-11 MED FILL — Oxycodone w/ Acetaminophen Tab 5-325 MG: ORAL | Qty: 6 | Status: AC

## 2013-02-12 ENCOUNTER — Other Ambulatory Visit: Payer: Self-pay | Admitting: Internal Medicine

## 2013-02-12 ENCOUNTER — Inpatient Hospital Stay: Payer: Self-pay | Admitting: Internal Medicine

## 2013-02-12 LAB — COMPREHENSIVE METABOLIC PANEL
Albumin: 4.2 g/dL (ref 3.4–5.0)
Alkaline Phosphatase: 65 U/L (ref 50–136)
Anion Gap: 8 (ref 7–16)
Bilirubin,Total: 0.7 mg/dL (ref 0.2–1.0)
Bilirubin,Total: 0.6 mg/dL (ref 0.2–1.0)
Calcium, Total: 8.6 mg/dL (ref 8.5–10.1)
Calcium, Total: 9.1 mg/dL (ref 8.5–10.1)
Chloride: 107 mmol/L (ref 98–107)
Chloride: 104 mmol/L (ref 98–107)
Creatinine: 1.23 mg/dL (ref 0.60–1.30)
EGFR (Non-African Amer.): >60
Glucose: 120 mg/dL — ABNORMAL HIGH (ref 65–99)
Glucose: 145 mg/dL — ABNORMAL HIGH (ref 65–99)
Osmolality: 277 (ref 275–301)
Potassium: 3.7 mmol/L (ref 3.5–5.1)
SGPT (ALT): 31 U/L (ref 12–78)
Sodium: 135 mmol/L — ABNORMAL LOW (ref 136–145)
Total Protein: 8.2 g/dL (ref 6.4–8.2)

## 2013-02-12 LAB — CBC WITH DIFFERENTIAL/PLATELET
Basophil #: 0.1 10*3/uL (ref 0.0–0.1)
Eosinophil %: 1.9 %
HCT: 37.8 % — ABNORMAL LOW (ref 40.0–52.0)
HGB: 12.9 g/dL — ABNORMAL LOW (ref 13.0–18.0)
Lymphocyte %: 10.9 %
MCH: 30.9 pg (ref 26.0–34.0)
Monocyte #: 0.7 x10 3/mm (ref 0.2–1.0)
RDW: 13.7 % (ref 11.5–14.5)
WBC: 9.6 10*3/uL (ref 3.8–10.6)

## 2013-02-12 LAB — URINALYSIS, COMPLETE
Protein: 30
RBC,UR: <1 /HPF (ref 0–5)
Squamous Epithelial: 1

## 2013-02-12 LAB — CBC
HGB: 12.9 g/dL — ABNORMAL LOW (ref 13.0–18.0)
MCH: 30.6 pg (ref 26.0–34.0)
MCHC: 33.7 g/dL (ref 32.0–36.0)
RBC: 4.22 10*6/uL — ABNORMAL LOW (ref 4.40–5.90)
RDW: 13.5 % (ref 11.5–14.5)

## 2013-02-12 LAB — AMYLASE: Amylase: 204 U/L — ABNORMAL HIGH (ref 25–115)

## 2013-02-12 LAB — LIPASE, BLOOD: Lipase: >3000 U/L (ref 73–393)

## 2013-02-13 LAB — BASIC METABOLIC PANEL
Anion Gap: 7 (ref 7–16)
BUN: 10 mg/dL (ref 7–18)
Co2: 22 mmol/L (ref 21–32)
Creatinine: 0.99 mg/dL (ref 0.60–1.30)

## 2013-02-13 LAB — CBC WITH DIFFERENTIAL/PLATELET
Basophil %: 0.6 %
HGB: 12.2 g/dL — ABNORMAL LOW (ref 13.0–18.0)
MCV: 90 fL (ref 80–100)
Monocyte #: 0.7 x10 3/mm (ref 0.2–1.0)
Monocyte %: 7.9 %
Neutrophil #: 7.2 10*3/uL — ABNORMAL HIGH (ref 1.4–6.5)
Neutrophil %: 82.1 %
RBC: 4.02 10*6/uL — ABNORMAL LOW (ref 4.40–5.90)
WBC: 8.8 10*3/uL (ref 3.8–10.6)

## 2013-02-13 LAB — LIPID PANEL
HDL Cholesterol: 68 mg/dL — ABNORMAL HIGH (ref 40–60)
Triglycerides: 87 mg/dL (ref 0–200)

## 2013-02-13 LAB — MAGNESIUM: Magnesium: 1.6 mg/dL — ABNORMAL LOW

## 2013-02-14 LAB — MAGNESIUM: Magnesium: 2.2 mg/dL

## 2013-02-22 ENCOUNTER — Ambulatory Visit: Payer: Self-pay | Admitting: Internal Medicine

## 2014-08-30 ENCOUNTER — Encounter (HOSPITAL_COMMUNITY): Payer: Self-pay | Admitting: *Deleted

## 2014-08-30 ENCOUNTER — Emergency Department (HOSPITAL_COMMUNITY)
Admission: EM | Admit: 2014-08-30 | Discharge: 2014-08-30 | Disposition: A | Discharge status: Home / Self Care | Payer: 59 | Attending: Emergency Medicine | Admitting: Emergency Medicine

## 2014-08-30 DIAGNOSIS — W228XXA Striking against or struck by other objects, initial encounter: Secondary | ICD-10-CM | POA: Diagnosis not present

## 2014-08-30 DIAGNOSIS — S0591XA Unspecified injury of right eye and orbit, initial encounter: Secondary | ICD-10-CM | POA: Diagnosis present

## 2014-08-30 DIAGNOSIS — Y998 Other external cause status: Secondary | ICD-10-CM | POA: Diagnosis not present

## 2014-08-30 DIAGNOSIS — Y9389 Activity, other specified: Secondary | ICD-10-CM | POA: Diagnosis not present

## 2014-08-30 DIAGNOSIS — I10 Essential (primary) hypertension: Secondary | ICD-10-CM | POA: Insufficient documentation

## 2014-08-30 DIAGNOSIS — Z79899 Other long term (current) drug therapy: Secondary | ICD-10-CM | POA: Diagnosis not present

## 2014-08-30 DIAGNOSIS — Y9289 Other specified places as the place of occurrence of the external cause: Secondary | ICD-10-CM | POA: Insufficient documentation

## 2014-08-30 DIAGNOSIS — S0501XA Injury of conjunctiva and corneal abrasion without foreign body, right eye, initial encounter: Secondary | ICD-10-CM | POA: Insufficient documentation

## 2014-08-30 DIAGNOSIS — Z8719 Personal history of other diseases of the digestive system: Secondary | ICD-10-CM | POA: Insufficient documentation

## 2014-08-30 MED ORDER — TOBRAMYCIN 0.3 % OP SOLN
1.0000 [drp] | Freq: Once | OPHTHALMIC | Status: AC
  Administered 2014-08-30: 1 [drp] via OPHTHALMIC
  Filled 2014-08-30: qty 5

## 2014-08-30 MED ORDER — TETRACAINE HCL 0.5 % OP SOLN
2.0000 [drp] | Freq: Once | OPHTHALMIC | Status: AC
  Administered 2014-08-30: 2 [drp] via OPHTHALMIC
  Filled 2014-08-30: qty 2

## 2014-08-30 MED ORDER — KETOROLAC TROMETHAMINE 0.5 % OP SOLN
1.0000 [drp] | Freq: Once | OPHTHALMIC | Status: AC
  Administered 2014-08-30: 1 [drp] via OPHTHALMIC
  Filled 2014-08-30: qty 5

## 2014-08-30 MED ORDER — FLUORESCEIN SODIUM 1 MG OP STRP
1.0000 | ORAL_STRIP | Freq: Once | OPHTHALMIC | Status: AC
  Administered 2014-08-30: 1 via OPHTHALMIC
  Filled 2014-08-30: qty 1

## 2014-08-30 NOTE — Discharge Instructions (Signed)
Corneal Abrasion °The cornea is the clear covering at the front and center of the eye. When looking at the colored portion of the eye (iris), you are looking through the cornea. This very thin tissue is made up of many layers. The surface layer is a single layer of cells (corneal epithelium) and is one of the most sensitive tissues in the body. If a scratch or injury causes the corneal epithelium to come off, it is called a corneal abrasion. If the injury extends to the tissues below the epithelium, the condition is called a corneal ulcer. °CAUSES  °· Scratches. °· Trauma. °· Foreign body in the eye. °Some people have recurrences of abrasions in the area of the original injury even after it has healed (recurrent erosion syndrome). Recurrent erosion syndrome generally improves and goes away with time. °SYMPTOMS  °· Eye pain. °· Difficulty or inability to keep the injured eye open. °· The eye becomes very sensitive to light. °· Recurrent erosions tend to happen suddenly, first thing in the morning, usually after waking up and opening the eye. °DIAGNOSIS  °Your health care provider can diagnose a corneal abrasion during an eye exam. Dye is usually placed in the eye using a drop or a small paper strip moistened by your tears. When the eye is examined with a special light, the abrasion shows up clearly because of the dye. °TREATMENT  °· Small abrasions may be treated with antibiotic drops or ointment alone. °· A pressure patch may be put over the eye. If this is done, follow your doctor's instructions for when to remove the patch. Do not drive or use machines while the eye patch is on. Judging distances is hard to do with a patch on. °If the abrasion becomes infected and spreads to the deeper tissues of the cornea, a corneal ulcer can result. This is serious because it can cause corneal scarring. Corneal scars interfere with light passing through the cornea and cause a loss of vision in the involved eye. °HOME CARE  INSTRUCTIONS °· Use medicine or ointment as directed. Only take over-the-counter or prescription medicines for pain, discomfort, or fever as directed by your health care provider. °· Do not drive or operate machinery if your eye is patched. Your ability to judge distances is impaired. °· If your health care provider has given you a follow-up appointment, it is very important to keep that appointment. Not keeping the appointment could result in a severe eye infection or permanent loss of vision. If there is any problem keeping the appointment, let your health care provider know. °SEEK MEDICAL CARE IF:  °· You have pain, light sensitivity, and a scratchy feeling in one eye or both eyes. °· Your pressure patch keeps loosening up, and you can blink your eye under the patch after treatment. °· Any kind of discharge develops from the eye after treatment or if the lids stick together in the morning. °· You have the same symptoms in the morning as you did with the original abrasion days, weeks, or months after the abrasion healed. °MAKE SURE YOU:  °· Understand these instructions. °· Will watch your condition. °· Will get help right away if you are not doing well or get worse. °Document Released: 10/07/2000 Document Revised: 10/15/2013 Document Reviewed: 06/17/2013 °Exit[employer]® Patient Information ©2015 [employer]. This information is not intended to replace advice given to you by your health care provider. Make sure you discuss any questions you have with your health care provider. ° °

## 2014-08-30 NOTE — ED Notes (Signed)
Pt states his hit self to right eye with hairbrush Thursday night and eye has been irritated since

## 2014-08-30 NOTE — ED Notes (Signed)
Pt states he thinks he scratched his eye. Thursday night he accidentally hit his eye with his hair brush. Pts eye is reddened. Pt states he can see out of his eye with no vision problems

## 2014-09-01 NOTE — ED Provider Notes (Signed)
CSN: [id number]     Arrival date & time 08/30/14  1259 History   First MD Initiated Contact with Patient 08/30/14 1402     Chief Complaint  Patient presents with  . Eye Injury     (Consider location/radiation/quality/duration/timing/severity/associated sxs/prior Treatment) HPI   Geoffrey Conner is a 38 y.o. male who presents to the Emergency Department complaining of pain and redness of the right eye for two days.  He states the pain began aftr he accidentally struck his eye with one of the bristles from a hairbrush.  He reports increased tearing and light sensitivity to the eye.  He has tried OTC eye drops w/o relief.  He denies contact use, headache, dizziness, visual changes or swelling of the face.     Past Medical History  Diagnosis Date  . Hypertension   . Pancreatitis oct  2012  . HTN (hypertension), malignant 08/02/2011  . Abnormal thyroid function test 06/08/2012    Low TSH and normal free T4   Past Surgical History  Procedure Laterality Date  . No past surgeries     Family History  Problem Relation Age of Onset  . Hypertension Mother   . Hypertension Father   . Cancer Mother     breast 9 yrs ago   History  Substance Use Topics  . Smoking status: Former Games developermoker  . Smokeless tobacco: Not on file  . Alcohol Use: 1.2 oz/week    2 Cans of beer per week     Comment:  2 beers/day    Review of Systems  Constitutional: Negative for fever and chills.  HENT: Negative for congestion, sore throat and trouble swallowing.   Eyes: Positive for photophobia, pain and redness. Negative for discharge, itching and visual disturbance.  Neurological: Negative for dizziness, facial asymmetry, light-headedness and headaches.  All other systems reviewed and are negative.     Allergies  Peanut-containing drug products and Hydromorphone  Home Medications   Prior to Admission medications   Medication Sig Start Date End Date Taking? Authorizing Provider  atenolol (TENORMIN) 50 MG  tablet Take 1 tablet (50 mg total) by mouth every morning. Patient not taking: Reported on 08/30/2014 06/09/12   Elliot Cousinenise Fisher, MD  promethazine (PHENERGAN) 25 MG tablet Take 1 tablet (25 mg total) by mouth every 6 (six) hours as needed for nausea. Patient not taking: Reported on 08/30/2014 06/04/12 06/11/12  Vanetta MuldersScott Zackowski, MD   BP 161/100 mmHg  Pulse 62  Temp(Src) 98.6 F (37 C) (Oral)  Resp 18  SpO2 100% Physical Exam  Constitutional: He is oriented to person, place, and time. He appears well-developed and well-nourished. No distress.  HENT:  Head: Normocephalic and atraumatic.  Mouth/Throat: Oropharynx is clear and moist.  Eyes: EOM and lids are normal. Pupils are equal, round, and reactive to light. Lids are everted and swept, no foreign bodies found. Right eye exhibits no chemosis, no discharge and no exudate. No foreign body present in the right eye. Right conjunctiva is injected. Left conjunctiva is not injected. Right eye exhibits normal extraocular motion. Left eye exhibits normal extraocular motion.  Slit lamp exam:      The right eye shows corneal abrasion and fluorescein uptake. The right eye shows no corneal flare, no corneal ulcer and no foreign body.    Slit lamp exam reveals corneal abrasion at the 3 o'clock position over the iris.  negative Seidel's sign, no hyphema or FB   Neck: Normal range of motion. Neck supple. No thyromegaly present.  Cardiovascular: Normal rate, regular rhythm, normal heart sounds and intact distal pulses.   No murmur heard. Pulmonary/Chest: Effort normal and breath sounds normal. No respiratory distress.  Musculoskeletal: Normal range of motion.  Lymphadenopathy:    He has no cervical adenopathy.  Neurological: He is alert and oriented to person, place, and time. He exhibits normal muscle tone. Coordination normal.  Skin: Skin is warm and dry. No rash noted.  Nursing note and vitals reviewed.   ED Course  Procedures (including critical care  time) Labs Review Labs Reviewed - No data to display  Imaging Review No results found.   EKG Interpretation None      MDM   Final diagnoses:  Corneal abrasion, right, initial encounter    Pt is feeling better.  Corneal abrasion w/o FB or evidence of penetrating trauma.       Visual Acuity  Right Eye Distance:   Left Eye Distance:   Bilateral Distance:    Right Eye Near: R Near: 20/13 Left Eye Near:  L Near: 20/13 Bilateral Near:  20/10   Tobramycin adn ketorolac drops dispensed.  Pt agrees to close f/u with Dr. Lita MainsHaines in few days if sx's are not improving.      Hadli Vandemark L. Trisha Mangleriplett, PA-C 09/01/14 1652  Rolland PorterMark James, MD 09/12/14 [phone]

## 2014-10-02 IMAGING — CR RIGHT LITTLE FINGER 2+V
1 series · 3 of 3 positions shown · non-contrast
Comparison: none

REASON FOR EXAM: finger pain
COMMENTS:

[Series 1: pa · 0.17mm/px · 3 of 3 slices shown]
[im 1/3]
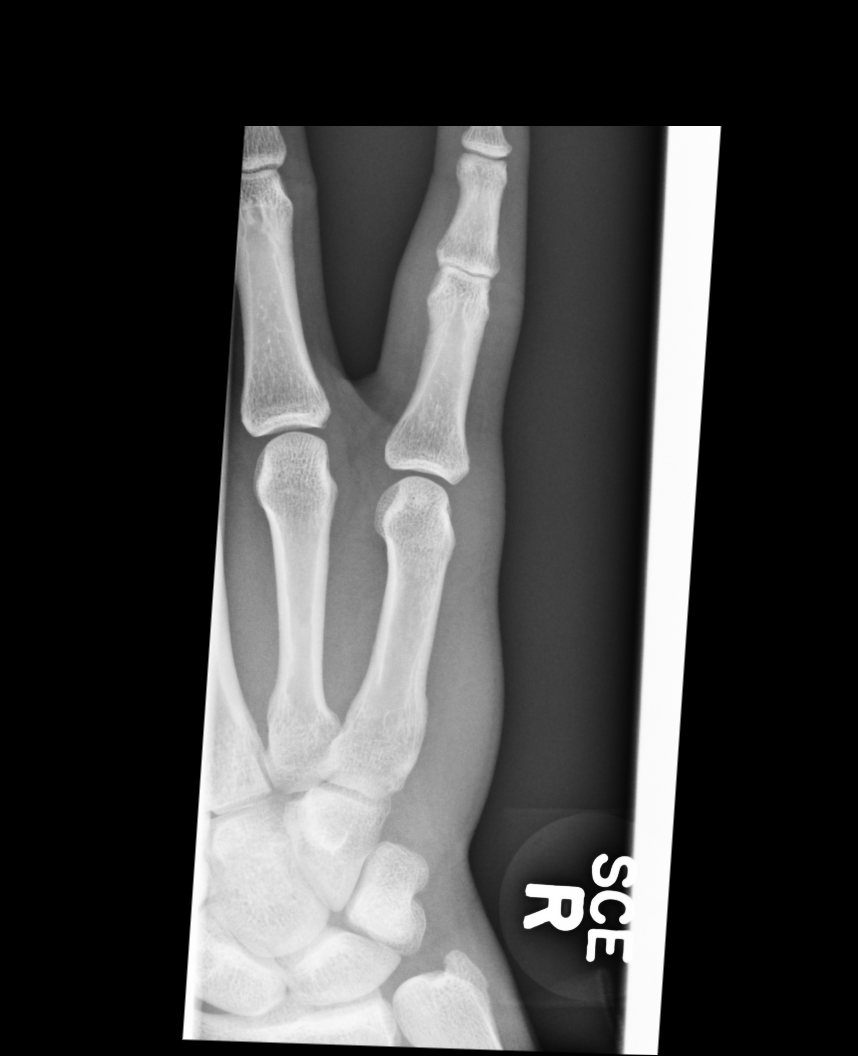
[im 2/3]
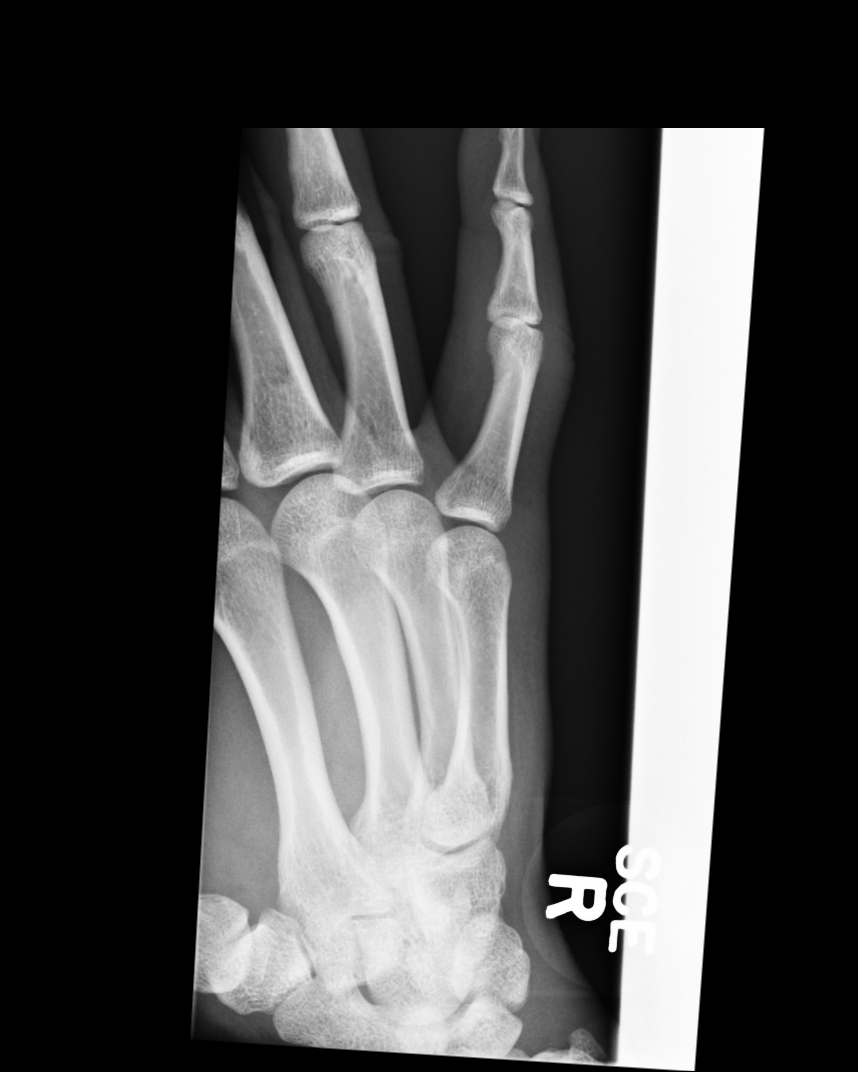
[im 3/3]
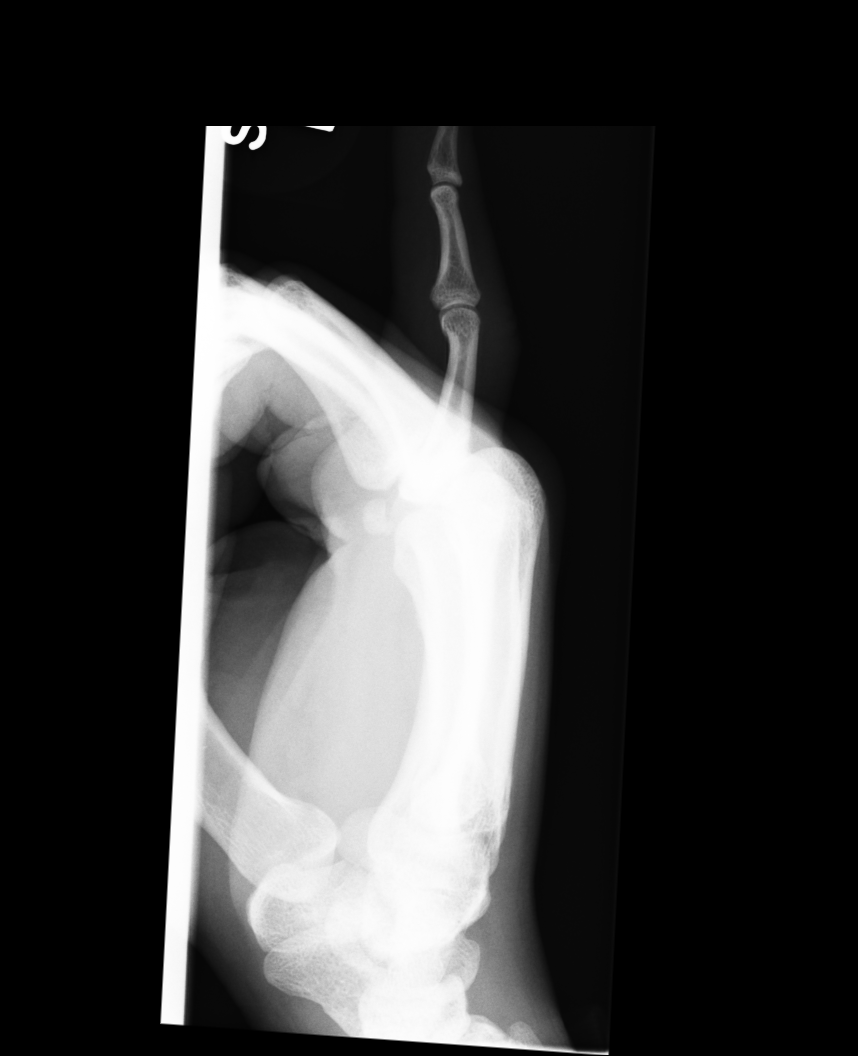

[3 of 3 positions shown; findings below may reference images not displayed]

PROCEDURE:     DXR - DXR FINGER PINKY 5TH DIGIT RT HA  - February 22, 2013 [DATE]

RESULT:     Three views of the right fifth finger reveal the bones to be
adequately mineralized. There is no evidence of an acute fracture. There is
subjective very mild narrowing of the PIP joint. There is overlying soft
tissue swelling here. The fifth metacarpal appears intact.
IMPRESSION: There is mild narrowing of the PIP joint of the right fifth
finger with overlying soft tissue swelling. Has there been injury here?

[REDACTED]

## 2015-02-13 NOTE — Discharge Summary (Signed)
PATIENT NAME:  Geoffrey Conner, Geoffrey D MR#:  [id number] DATE OF BIRTH:  21-Jul-1976  DATE OF ADMISSION:  02/12/2013 DATE OF DISCHARGE:  02/14/2013  PRIMARY CARE PHYSICIAN:  Dr. Alison MurrayAndreassi  DISCHARGE DIAGNOSES:  Acute pancreatitis, alcohol abuse, hypertension and acute renal failure.  CODE STATUS:  FULL CODE.   HOME MEDICATIONS:  Omeprazole 40 mg p.o. daily, atenolol 50 mg p.o. daily, Norvasc 10 mg p.o. daily.   DIET:  Low-sodium, low-fat and low-cholesterol diet.   ACTIVITY:  As tolerated.  FOLLOW-UP CARE:  Follow up with PCP p.r.n.  The patient needs to avoid fatty foods, alcohol cessation.   REASON FOR ADMISSION:  Abdominal pain, 1 day.   HOSPITAL COURSE: The patient is a 39 year old African American male with a history of pancreatitis, hypertension, alcoholism, who presented to the ED with abdominal pain, 1 day, but no nausea, vomiting or diarrhea. The patient's lipase was more than 3000. He admitted that he drank alcohol the day before admission. For detailed of history and physical examination, please refer to the admission note dictated by me.  On admission date, the patient's urinalysis is negative. Lipase is more than 3000, white count 10, hemoglobin 12.9, BUN 16, creatinine 1.39, amylase 205.  The patient was admitted for acute pancreatitis, acute renal failure, hypertension and alcohol abuse. After admission, the patient has been kept n.p.o. and IV fluid support. In addition, the patient has been treated with CIWA protocol with banana bag IV. The patient also has been treated with atenolol, Norvasc for hypertension. After above-mentioned treatment, the patient's symptoms largely improved. He has no abdominal pain, nausea, vomiting or diarrhea. He tolerated regular food well. The patient's lipase decreased to 579 and magnesium 2.2. Abdominal ultrasound is unremarkable. The patient is clinically stable. The patient is being discharged home today. I discussed the patient's discharge plan with the  patient, otherwise the patient is stopping alcohol drinking and avoid fatty food.   TIME SPENT:  About 35 minutes     ____________________________ Shaune PollackQing Elizah Lydon, MD qc:ce D: 02/14/2013 15:06:55 ET T: 02/14/2013 16:56:23 ET JOB#: [id number]  cc: Shaune PollackQing Muzammil Bruins, MD, <Dictator> Shaune PollackQING Emidio Warrell MD ELECTRONICALLY SIGNED 02/15/2013 14:56

## 2015-02-13 NOTE — H&P (Signed)
PATIENT NAME:  Geoffrey Conner, Geoffrey Conner MR#:  [id number] DATE OF BIRTH:  1976-05-16  DATE OF ADMISSION:  02/12/2013  PRIMARY CARE PHYSICIAN:  Nonlocal.  REFERRING PHYSICIAN:  Dr. Manson PasseyBrown.  CHIEF COMPLAINT:  Abdominal pain, 1 day.  HISTORY OF PRESENT ILLNESS:  This is a 39 year old male with a history of pancreatitis, hypertension, alcoholism, presents in the ED with abdominal pain one day which is the middle part of the abdomen, aching, 8 to 10 out of 10, radiation to the back, intermittent to constant.  The patient is alert, awake, oriented.  He said he drank a lot of alcohol two days ago, but he denies any fever, chills.  No nausea, vomiting, diarrhea.  This is the 3rd time that the patient has had pancreatitis.  The patient denies any gallbladder stone history, but he drinks like 3 drinks 2 to 3 times a week.   PAST MEDICAL HISTORY:  As mentioned above, hypertension, pancreatitis, alcoholism.   SOCIAL HISTORY:  Denies any smoking, but drinks alcohol 2 to 3 times a day, especially on weekend.  Denies any drug abuse.   PAST SURGICAL HISTORY:  No.   FAMILY HISTORY:  Hypertension.   ALLERGIES:  No.   HOME MEDICATIONS:  Omeprazole 40 mg by mouth daily, atenolol 50 mg by mouth daily, Norvasc 10 mg by mouth daily.   REVIEW OF SYSTEMS:  CONSTITUTIONAL:  The patient denies any fever, chills.  No headache or dizziness.  No weakness.  EYES:  No double vision, blurred vision.  EARS, NOSE, THROAT:  No postnasal drip, slurred speech or dysphagia.  No epistaxis.  CARDIOVASCULAR:  No chest pain, palpitation, orthopnea, or nocturnal dyspnea.  No leg edema.  PULMONARY:  No cough, sputum, shortness of breath or hemoptysis.  GASTROINTESTINAL:  Positive for abdominal pain, but no nausea, vomiting, or diarrhea.  No melena or bloody stool.  GENITOURINARY:  No dysuria, hematuria, or incontinence.  SKIN:  No rash or jaundice.  NEUROLOGY:  No syncope, loss of consciousness or seizure.   PHYSICAL EXAMINATION: VITAL  SIGNS:  Temperature 98.8, blood pressure 152/93, pulse 88, respirations 18, O2 saturation 100% on room air.  GENERAL:  The patient is alert, awake, oriented, in no acute distress.  HEENT:  Pupils round, equal and reactive to light and accommodation.  NECK:  Supple.  No JVD or carotid bruits.  No lymphadenopathy.  No thyromegaly.  Moist oral mucosa.  Clear oropharynx.  CARDIOVASCULAR:  S1, S2, regular rate, rhythm.  No murmurs, gallops.  PULMONARY:  Bilateral air entry.  No wheezing or rales.  No use of accessory muscles to breathe.  ABDOMEN:  Soft.  Bowel sounds present.  Tenderness in the middle part of the abdomen.  No rigidity.  No rebound.  No organomegaly.  EXTREMITIES:  No edema, clubbing, or cyanosis.  No calf tenderness.  Strong bilateral pedal pulses.  NEUROLOGY:  A and O x 3.  No focal deficit.  Power 5 out of 5.  Sensation intact.   LABORATORY DATA:  Lipase more than 3000.  Urinalysis is negative.  WBC 10, hemoglobin 12.9, platelets 286, glucose 145, BUN 16, creatinine 1.39, sodium 135, potassium 3.7, chloride 104, bicarbonate 21.  The patient's creatinine earlier today was 1.23.  Amylase 205.  IMPRESSION: 1.  Acute pancreatitis.  2.  Acute renal failure.  3.  Hypertension.  4.  Alcohol abuse.   PLAN OF TREATMENT: 1.  The patient will be kept nothing by mouth except for meds.  We will start normal saline  IV and follow up BMP and check a repeat lipase level.  2.  Continue atenolol, Norvasc for hypertension.  3.  Start CIWA protocol.  4.  Discuss the patient's condition and plan of treatment with the patient.   5.  The patient is a FULL CODE.   TIME SPENT:  About 50 minutes.    ____________________________ Shaune Pollack, MD qc:ea Conner: 02/12/2013 21:51:00 ET T: 02/12/2013 22:02:31 ET JOB#: [id number]  cc: Shaune Pollack, MD, <Dictator> Shaune Pollack MD ELECTRONICALLY SIGNED 02/13/2013 14:21
